# Patient Record
Sex: Female | Born: 1980 | Race: White | Hispanic: No | Marital: Married | Smoking: Never smoker
Health system: Southern US, Community
[De-identification: ages and names within clinical notes are randomized; demographics above are authoritative.]

## PROBLEM LIST (undated history)

## (undated) ENCOUNTER — Inpatient Hospital Stay (HOSPITAL_COMMUNITY): Payer: Self-pay

## (undated) DIAGNOSIS — E039 Hypothyroidism, unspecified: Secondary | ICD-10-CM

## (undated) DIAGNOSIS — Z789 Other specified health status: Secondary | ICD-10-CM

## (undated) DIAGNOSIS — N979 Female infertility, unspecified: Secondary | ICD-10-CM

## (undated) HISTORY — PX: WISDOM TOOTH EXTRACTION: SHX21

---

## 2012-10-31 ENCOUNTER — Ambulatory Visit (HOSPITAL_COMMUNITY)
Admission: AD | Admit: 2012-10-31 | Discharge: 2012-10-31 | Disposition: A | Discharge status: Home / Self Care | Payer: BC Managed Care – PPO | Source: Ambulatory Visit | Attending: Obstetrics & Gynecology | Admitting: Obstetrics & Gynecology

## 2012-10-31 ENCOUNTER — Encounter (HOSPITAL_COMMUNITY)
Admission: AD | Disposition: A | Discharge status: Home / Self Care | Payer: Self-pay | Source: Ambulatory Visit | Attending: Obstetrics & Gynecology

## 2012-10-31 ENCOUNTER — Ambulatory Visit (HOSPITAL_COMMUNITY): Payer: BC Managed Care – PPO | Admitting: Anesthesiology

## 2012-10-31 ENCOUNTER — Encounter (HOSPITAL_COMMUNITY): Payer: Self-pay | Admitting: Pharmacy Technician

## 2012-10-31 ENCOUNTER — Encounter (HOSPITAL_COMMUNITY): Payer: Self-pay | Admitting: Anesthesiology

## 2012-10-31 ENCOUNTER — Encounter (HOSPITAL_COMMUNITY): Payer: Self-pay | Admitting: *Deleted

## 2012-10-31 DIAGNOSIS — N803 Endometriosis of pelvic peritoneum, unspecified: Secondary | ICD-10-CM | POA: Insufficient documentation

## 2012-10-31 DIAGNOSIS — K661 Hemoperitoneum: Secondary | ICD-10-CM | POA: Insufficient documentation

## 2012-10-31 DIAGNOSIS — O00109 Unspecified tubal pregnancy without intrauterine pregnancy: Secondary | ICD-10-CM | POA: Insufficient documentation

## 2012-10-31 HISTORY — PX: LAPAROSCOPY: SHX197

## 2012-10-31 HISTORY — PX: CHROMOPERTUBATION: SHX6288

## 2012-10-31 HISTORY — PX: ABLATION ON ENDOMETRIOSIS: SHX5787

## 2012-10-31 HISTORY — PX: SALPINGOOPHORECTOMY: SHX82

## 2012-10-31 LAB — CBC
HCT: 37.8 % (ref 36.0–46.0)
Hemoglobin: 13.5 g/dL (ref 12.0–15.0)
MCH: 30.5 pg (ref 26.0–34.0)
MCHC: 35.7 g/dL (ref 30.0–36.0)
MCV: 85.5 fL (ref 78.0–100.0)
Platelets: 240 10*3/uL (ref 150–400)
RBC: 4.42 MIL/uL (ref 3.87–5.11)
RDW: 11.7 % (ref 11.5–15.5)
WBC: 7.5 10*3/uL (ref 4.0–10.5)

## 2012-10-31 SURGERY — LAPAROSCOPY OPERATIVE
Anesthesia: General | Site: Abdomen | Laterality: Right | Wound class: Clean Contaminated

## 2012-10-31 MED ORDER — NEOSTIGMINE METHYLSULFATE 1 MG/ML IJ SOLN
INTRAMUSCULAR | Status: AC
  Filled 2012-10-31: qty 1

## 2012-10-31 MED ORDER — MIDAZOLAM HCL 2 MG/2ML IJ SOLN
INTRAMUSCULAR | Status: AC
  Filled 2012-10-31: qty 2

## 2012-10-31 MED ORDER — LACTATED RINGERS IR SOLN
Status: DC | PRN
  Administered 2012-10-31: 1000 mL

## 2012-10-31 MED ORDER — MEPERIDINE HCL 25 MG/ML IJ SOLN: 6.2500 mg | INTRAMUSCULAR | Status: DC | PRN

## 2012-10-31 MED ORDER — PROPOFOL 10 MG/ML IV BOLUS
INTRAVENOUS | Status: DC | PRN
  Administered 2012-10-31: 150 mg via INTRAVENOUS

## 2012-10-31 MED ORDER — DEXAMETHASONE SODIUM PHOSPHATE 10 MG/ML IJ SOLN
INTRAMUSCULAR | Status: DC | PRN
  Administered 2012-10-31: 10 mg via INTRAVENOUS

## 2012-10-31 MED ORDER — PROPOFOL 10 MG/ML IV EMUL
INTRAVENOUS | Status: AC
  Filled 2012-10-31: qty 20

## 2012-10-31 MED ORDER — CEFAZOLIN SODIUM-DEXTROSE 2-3 GM-% IV SOLR
INTRAVENOUS | Status: AC
  Filled 2012-10-31: qty 50

## 2012-10-31 MED ORDER — LACTATED RINGERS IR SOLN
Status: DC | PRN
  Administered 2012-10-31: 3000 mL

## 2012-10-31 MED ORDER — LACTATED RINGERS IV SOLN: INTRAVENOUS | Status: DC

## 2012-10-31 MED ORDER — ROCURONIUM BROMIDE 100 MG/10ML IV SOLN
INTRAVENOUS | Status: DC | PRN
  Administered 2012-10-31: 30 mg via INTRAVENOUS

## 2012-10-31 MED ORDER — ONDANSETRON HCL 4 MG/2ML IJ SOLN
INTRAMUSCULAR | Status: DC | PRN
  Administered 2012-10-31: 4 mg via INTRAVENOUS

## 2012-10-31 MED ORDER — FENTANYL CITRATE 0.05 MG/ML IJ SOLN
INTRAMUSCULAR | Status: DC | PRN
  Administered 2012-10-31: 150 ug via INTRAVENOUS
  Administered 2012-10-31: 100 ug via INTRAVENOUS

## 2012-10-31 MED ORDER — KETOROLAC TROMETHAMINE 30 MG/ML IJ SOLN: 15.0000 mg | Freq: Once | INTRAMUSCULAR | Status: DC | PRN

## 2012-10-31 MED ORDER — NEOSTIGMINE METHYLSULFATE 1 MG/ML IJ SOLN
INTRAMUSCULAR | Status: DC | PRN
  Administered 2012-10-31: 2 mg via INTRAVENOUS

## 2012-10-31 MED ORDER — ROCURONIUM BROMIDE 50 MG/5ML IV SOLN
INTRAVENOUS | Status: AC
  Filled 2012-10-31: qty 1

## 2012-10-31 MED ORDER — OXYCODONE-ACETAMINOPHEN 5-325 MG PO TABS
ORAL_TABLET | ORAL | Status: AC
  Filled 2012-10-31: qty 1

## 2012-10-31 MED ORDER — LACTATED RINGERS IV SOLN
INTRAVENOUS | Status: DC
  Administered 2012-10-31 (×3): via INTRAVENOUS

## 2012-10-31 MED ORDER — BUPIVACAINE HCL (PF) 0.25 % IJ SOLN
INTRAMUSCULAR | Status: DC | PRN
  Administered 2012-10-31: 15 mL

## 2012-10-31 MED ORDER — FENTANYL CITRATE 0.05 MG/ML IJ SOLN
INTRAMUSCULAR | Status: AC
  Administered 2012-10-31: 50 ug via INTRAVENOUS
  Filled 2012-10-31: qty 2

## 2012-10-31 MED ORDER — GLYCOPYRROLATE 0.2 MG/ML IJ SOLN
INTRAMUSCULAR | Status: DC | PRN
  Administered 2012-10-31: 0.4 mg via INTRAVENOUS

## 2012-10-31 MED ORDER — KETOROLAC TROMETHAMINE 30 MG/ML IJ SOLN
INTRAMUSCULAR | Status: AC
  Filled 2012-10-31: qty 1

## 2012-10-31 MED ORDER — CEFAZOLIN SODIUM-DEXTROSE 2-3 GM-% IV SOLR
2.0000 g | Freq: Once | INTRAVENOUS | Status: AC
  Administered 2012-10-31: 2 g via INTRAVENOUS

## 2012-10-31 MED ORDER — METHYLENE BLUE 1 % INJ SOLN
INTRAMUSCULAR | Status: DC | PRN
  Administered 2012-10-31: 1 mL via SUBMUCOSAL

## 2012-10-31 MED ORDER — MIDAZOLAM HCL 5 MG/5ML IJ SOLN
INTRAMUSCULAR | Status: DC | PRN
  Administered 2012-10-31: 2 mg via INTRAVENOUS

## 2012-10-31 MED ORDER — FENTANYL CITRATE 0.05 MG/ML IJ SOLN
INTRAMUSCULAR | Status: AC
  Filled 2012-10-31: qty 5

## 2012-10-31 MED ORDER — PROMETHAZINE HCL 25 MG/ML IJ SOLN: 6.2500 mg | INTRAMUSCULAR | Status: DC | PRN

## 2012-10-31 MED ORDER — LIDOCAINE HCL (CARDIAC) 20 MG/ML IV SOLN
INTRAVENOUS | Status: AC
  Filled 2012-10-31: qty 5

## 2012-10-31 MED ORDER — OXYCODONE-ACETAMINOPHEN 5-325 MG PO TABS
1.0000 | ORAL_TABLET | ORAL | Status: DC | PRN
  Administered 2012-10-31: 1 via ORAL

## 2012-10-31 MED ORDER — OXYCODONE-ACETAMINOPHEN 7.5-325 MG PO TABS: 1.0000 | ORAL_TABLET | ORAL | Status: DC | PRN

## 2012-10-31 MED ORDER — GLYCOPYRROLATE 0.2 MG/ML IJ SOLN
INTRAMUSCULAR | Status: AC
  Filled 2012-10-31: qty 2

## 2012-10-31 MED ORDER — KETOROLAC TROMETHAMINE 30 MG/ML IJ SOLN
INTRAMUSCULAR | Status: DC | PRN
  Administered 2012-10-31: 30 mg via INTRAVENOUS

## 2012-10-31 MED ORDER — LIDOCAINE HCL (CARDIAC) 20 MG/ML IV SOLN
INTRAVENOUS | Status: DC | PRN
  Administered 2012-10-31: 60 mg via INTRAVENOUS

## 2012-10-31 MED ORDER — ONDANSETRON HCL 4 MG/2ML IJ SOLN
INTRAMUSCULAR | Status: AC
  Filled 2012-10-31: qty 2

## 2012-10-31 MED ORDER — DEXAMETHASONE SODIUM PHOSPHATE 10 MG/ML IJ SOLN
INTRAMUSCULAR | Status: AC
  Filled 2012-10-31: qty 1

## 2012-10-31 MED ORDER — FENTANYL CITRATE 0.05 MG/ML IJ SOLN
25.0000 ug | INTRAMUSCULAR | Status: DC | PRN
  Administered 2012-10-31 (×2): 50 ug via INTRAVENOUS

## 2012-10-31 MED ORDER — MIDAZOLAM HCL 2 MG/2ML IJ SOLN: 0.5000 mg | Freq: Once | INTRAMUSCULAR | Status: DC | PRN

## 2012-10-31 SURGICAL SUPPLY — 29 items
APPLICATOR COTTON TIP 6IN STRL (MISCELLANEOUS) ×5 IMPLANT
CABLE HIGH FREQUENCY MONO STRZ (ELECTRODE) IMPLANT
CATH ROBINSON RED A/P 16FR (CATHETERS) ×5 IMPLANT
CLOTH BEACON ORANGE TIMEOUT ST (SAFETY) ×5 IMPLANT
DERMABOND ADVANCED (GAUZE/BANDAGES/DRESSINGS) ×1
DERMABOND ADVANCED .7 DNX12 (GAUZE/BANDAGES/DRESSINGS) ×4 IMPLANT
GLOVE BIO SURGEON STRL SZ 6.5 (GLOVE) ×5 IMPLANT
GLOVE BIOGEL PI IND STRL 7.0 (GLOVE) ×4 IMPLANT
GLOVE BIOGEL PI INDICATOR 7.0 (GLOVE) ×1
GOWN PREVENTION PLUS LG XLONG (DISPOSABLE) ×15 IMPLANT
IV STOPCOCK 4 WAY 40  W/Y SET (IV SOLUTION) ×1
IV STOPCOCK 4 WAY 40 W/Y SET (IV SOLUTION) ×4 IMPLANT
MANIPULATOR UTERINE 4.5 ZUMI (MISCELLANEOUS) IMPLANT
PACK LAPAROSCOPY BASIN (CUSTOM PROCEDURE TRAY) ×5 IMPLANT
PENCIL BUTTON HOLSTER BLD 10FT (ELECTRODE) ×5 IMPLANT
POUCH SPECIMEN RETRIEVAL 10MM (ENDOMECHANICALS) ×5 IMPLANT
PROTECTOR NERVE ULNAR (MISCELLANEOUS) ×5 IMPLANT
SEALER TISSUE G2 CVD JAW 35 (ENDOMECHANICALS) ×4 IMPLANT
SEALER TISSUE G2 CVD JAW 45CM (ENDOMECHANICALS) ×1 IMPLANT
SET IRRIG TUBING LAPAROSCOPIC (IRRIGATION / IRRIGATOR) ×5 IMPLANT
SUT MNCRL AB 4-0 PS2 18 (SUTURE) ×10 IMPLANT
SUT VICRYL 0 UR6 27IN ABS (SUTURE) ×10 IMPLANT
TOWEL OR 17X24 6PK STRL BLUE (TOWEL DISPOSABLE) ×10 IMPLANT
TRAY FOLEY CATH 14FR (SET/KITS/TRAYS/PACK) ×5 IMPLANT
TROCAR BALLN 12MMX100 BLUNT (TROCAR) ×5 IMPLANT
TROCAR XCEL NON-BLD 11X100MML (ENDOMECHANICALS) IMPLANT
TROCAR XCEL NON-BLD 5MMX100MML (ENDOMECHANICALS) ×5 IMPLANT
TROCAR XCEL OPT SLVE 5M 100M (ENDOMECHANICALS) IMPLANT
WATER STERILE IRR 1000ML POUR (IV SOLUTION) ×10 IMPLANT

## 2012-10-31 NOTE — Op Note (Signed)
10/31/2012  4:01 PM  PATIENT:  Kristen [name]  32 y.o. female  PRE-OPERATIVE DIAGNOSIS:  Left ectopic with cardiac activity  POST-OPERATIVE DIAGNOSIS:  Left ampullary ruptured ectopic.  Hemoperitoneum.  Normal patent right tube. Possible mild endometriosis.   PROCEDURE:  Procedure(s): LAPAROSCOPY OPERATIVE LEFT SALPINGECTOMY. CHROMOPERTUBATION CAUTHERIZATION OF ENDOMETRIOSIS  SURGEON:  Surgeon(s): Genia Del, MD  ASSISTANTS: none   ANESTHESIA:   general  PROCEDURE:  Under general anesthesia with endotracheal intubation the patient is in lithotomy position. She is prepped with ChloraPrep on the abdomen and with Betadine on the suprapubic, vulvar and vaginal areas. She is draped as usual. A Foley is introduced and the bladder. The speculum is inserted in the vagina, the anterior lip of the cervix is grasped with a tenaculum and the acorn is used to cannulate the uterus.  The speculum is removed. The infraumbilical area is infiltrated with Marcaine one quarter plain 10 cc. The aponeurosis is grasped with cokers and opened with Mayo scissors. The peritoneum is opened with Mayo scissors as well under direct vision. A pursestring stitch of Vicryl 0 is done on the aponeurosis. The Roseanne Reno is inserted under direct vision. The pneumoperitoneum is created with CO2. The camera is inserted at that level. 5 mm incisions are done on either aspect of the lower abdomen after infiltration of Marcaine one quarter plain at both levels. The 5 mm trochars were inserted on either side under direct vision.    We note a hemoperitoneum of around 100 cc. The left tube presents an ampullary ectopic pregnancy with a very thin wall and a blood clot and bleeding at the fimbriae.  The left ovary is normal in size and appearance. The right ovary is normal in size and appearance as well. The right tube appears normal with normal fimbriae, except for a tubal length which appears longer than average.  The uterus is normal  in size and appearance. A possible lesion of endometriosis is seen on the right posterior side of the uterus, external to the right uterosacral ligament.  Fenestration is present with a thicker whitish area which was later cauterized with N-seal.  Methylene blue is injected through the acorn and good passage of methylene blue is seen at the distal end of the right tube confirming patency.  We used N-seal to cauterize and section the proximal left tube and then cauterized and section the left mesosalpinx, detaching the left tube completely with the ectopic pregnancy.  We then switched to a 5 mm camera and used the Endobag at the infraumbilical port to remove the left tube with the ectopic. We sent the specimen to pathology.  Hemostasis was adequate at all levels. The Nezhat was used to irrigate and suctioned the abdominopelvic cavities.  We took pictures of the liver which appeared normal and the appendix which also appeared normal. We also took pictures of the pelvic organs after the left salpingectomy.  All instruments were removed. Trochars were removed under direct vision.  The CO2 was evacuated.  The pursestring stitch of Vicryl 0 was closed at the infraumbilical incision to close the aponeurosis. We then used Monocryl 4-0 to do a subcuticular stitch on all incisions. Dermabond was added on all incisions. Hemostasis was adequate.  The acorn and tenaculum were removed from the vagina.  The Foley was removed in the OR. The patient was brought to recovery room in good and stable status.  ESTIMATED BLOOD LOSS:  10 cc and hemoperitoneum of 100 cc.   Intake/Output Summary (Last  24 hours) at 10/31/12 1601 Last data filed at 10/31/12 1558  Gross per 24 hour  Intake   1000 ml  Output    210 ml  Net    790 ml     BLOOD ADMINISTERED:none   LOCAL MEDICATIONS USED:  MARCAINE     SPECIMEN:  Source of Specimen:  Left tube with ectopic  DISPOSITION OF SPECIMEN:  PATHOLOGY  COUNTS:  YES  PLAN OF CARE:  Transfer to PACU    Genia Del MD  10/31/2012 at 4:02 pm

## 2012-10-31 NOTE — Anesthesia Preprocedure Evaluation (Addendum)
Anesthesia Evaluation  Patient identified by MRN, date of birth, ID band Patient awake    Reviewed: Allergy & Precautions, H&P , Patient's Chart, lab work & pertinent test results, reviewed documented beta blocker date and time   History of Anesthesia Complications Negative for: history of anesthetic complications  Airway Mallampati: II TM Distance: >3 FB Neck ROM: full    Dental no notable dental hx.    Pulmonary neg pulmonary ROS,  breath sounds clear to auscultation  Pulmonary exam normal       Cardiovascular Exercise Tolerance: Good negative cardio ROS  Rhythm:regular Rate:Normal     Neuro/Psych negative neurological ROS  negative psych ROS   GI/Hepatic negative GI ROS, Neg liver ROS,   Endo/Other  negative endocrine ROS  Renal/GU negative Renal ROS     Musculoskeletal   Abdominal   Peds  Hematology negative hematology ROS (+)   Anesthesia Other Findings   Reproductive/Obstetrics negative OB ROS                           Anesthesia Physical Anesthesia Plan  ASA: I and emergent  Anesthesia Plan: General ETT   Post-op Pain Management:    Induction:   Airway Management Planned:   Additional Equipment:   Intra-op Plan:   Post-operative Plan:   Informed Consent: I have reviewed the patients History and Physical, chart, labs and discussed the procedure including the risks, benefits and alternatives for the proposed anesthesia with the patient or authorized representative who has indicated his/her understanding and acceptance.   Dental Advisory Given  Plan Discussed with: CRNA and Surgeon  Anesthesia Plan Comments:         Anesthesia Quick Evaluation

## 2012-10-31 NOTE — Transfer of Care (Signed)
Immediate Anesthesia Transfer of Care Note  Patient: Kristen [name]  Procedure(s) Performed: Procedure(s): LAPAROSCOPY OPERATIVE  SALPINGECTOMY (Right) CHROMOPERTUBATION SALPINGO OOPHORECTOMY ABLATION ON ENDOMETRIOSIS (N/A)  Patient Location: PACU  Anesthesia Type:General  Level of Consciousness: sedated  Airway & Oxygen Therapy: Patient Spontanous Breathing and Patient connected to nasal cannula oxygen  Post-op Assessment: Report given to PACU RN and Post -op Vital signs reviewed and stable  Post vital signs: stable  Complications: No apparent anesthesia complications

## 2012-10-31 NOTE — H&P (Signed)
Tejasvi GOLDA ZAVALZA is an 32 y.o. female G1 6 wks per LMP  RP:  Left ectopic pregnancy with cardiac activity 2.2 cm, no rupture  Pertinent Gynecological History: Menses: flow is moderate Bleeding: None currently Contraception: none Blood transfusions: none Sexually transmitted diseases: no past history Previous GYN Procedures: None  Last mammogram: None Last pap: Normal OB History: Current  Menstrual History:  No LMP recorded.    History reviewed. No pertinent past medical history.  History reviewed. No pertinent past surgical history.  History reviewed. No pertinent family history.  Social History:  reports that she has never smoked. She has never used smokeless tobacco. She reports that she does not drink alcohol or use illicit drugs.  Allergies: No Known Allergies  Prescriptions prior to admission  Medication Sig Dispense Refill  . Prenatal Vit-Fe Fumarate-FA (PRENATAL MULTIVITAMIN) TABS tablet Take 1 tablet by mouth daily at 12 noon.        @ROS @  Blood pressure 96/63, temperature 99.7 F (37.6 C), temperature source Oral, resp. rate 20, height 5\' 7"  (1.702 m), weight 62.143 kg (137 lb), SpO2 100.00%.  Pelvic US 10/31/2012  Left ectopic pregnancy with cardiac activity 2.2 cm.  No FF.  Otherwise normal pelvic US.  Results for orders placed during the hospital encounter of 10/31/12 (from the past 24 hour(s))  CBC     Status: None   Collection Time    10/31/12 10:30 AM      Result Value Range   WBC 7.5  4.0 - 10.5 K/uL   RBC 4.42  3.87 - 5.11 MIL/uL   Hemoglobin 13.5  12.0 - 15.0 g/dL   HCT 16.1  09.6 - 04.5 %   MCV 85.5  78.0 - 100.0 fL   MCH 30.5  26.0 - 34.0 pg   MCHC 35.7  30.0 - 36.0 g/dL   RDW 40.9  81.1 - 91.4 %   Platelets 240  150 - 400 K/uL  TYPE AND SCREEN     Status: None   Collection Time    10/31/12 10:30 AM      Result Value Range   ABO/RH(D) B POS     Antibody Screen POS     Sample Expiration 11/03/2012     Antibody Identification NO CLINICALLY  SIGNIFICANT ANTIBODY IDENTIFIED.     DAT, IgG NEG      No results found.  Assessment/Plan: Left ectopic pregnancy with cardiac activity present 2.2 cm, not ruptured.  Patient had infertility on Clomid/IUI.  Will proceed with LPS probable left Salpingectomy, possible left Salpingostomy, possible laparotomy.  Informed consent signed.  Risks of surgery, post op and best approach for future fertility reviewed.  Patient agrees and understands.  Sampson Self,MARIE-LYNE 10/31/2012, 2:09 PM

## 2012-10-31 NOTE — Anesthesia Postprocedure Evaluation (Signed)
  Anesthesia Post-op Note  Anesthesia Post Note  Patient: Kristen [name]  Procedure(s) Performed: Procedure(s) (LRB): LAPAROSCOPY OPERATIVE  SALPINGECTOMY (Right) CHROMOPERTUBATION SALPINGO OOPHORECTOMY ABLATION ON ENDOMETRIOSIS (N/A)  Anesthesia type: General  Patient location: PACU  Post pain: Pain level controlled  Post assessment: Post-op Vital signs reviewed  Last Vitals:  Filed Vitals:   10/31/12 1715  BP: 101/56  Pulse: 82  Temp: 37.1 C  Resp: 20    Post vital signs: Reviewed  Level of consciousness: sedated  Complications: No apparent anesthesia complications

## 2012-10-31 NOTE — Discharge Summary (Signed)
  Physician Discharge Summary  Patient ID: Kristen [name] MRN: [id number] DOB/AGE: August 31, 1980 32 y.o.  Admit date: 10/31/2012 Discharge date: 10/31/2012  Admission Diagnoses: ECTOPIC PREG ectopic with heart beat  Discharge Diagnoses: Left ampullary ruptured ectopic pregnancy, hemoperitoneum, possible mild endometriosis        Active Problems:   * No active hospital problems. *   Discharged Condition: good  Hospital Course:  Outpatient  Consults: None  Treatments: surgery:  Laparoscopic left Salpingectomy, evacuation of hemoperitoneum, cautherization of endometriosis.  Disposition: Final discharge disposition not confirmed     Medication List    STOP taking these medications       prenatal multivitamin Tabs tablet      TAKE these medications       oxyCODONE-acetaminophen 7.5-325 MG per tablet  Commonly known as:  PERCOCET  Take 1 tablet by mouth every 4 (four) hours as needed for pain.           Follow-up Information   Follow up with Latanya Hemmer,MARIE-LYNE, MD In 3 weeks. (Post op with Dr Juliene Pina at patient's discretion)    Specialty:  Obstetrics and Gynecology   Contact information:   [address] [place] [address] [phone]       Signed: Genia Del, MD 10/31/2012, 4:24 PM

## 2012-11-01 ENCOUNTER — Encounter (HOSPITAL_COMMUNITY): Payer: Self-pay | Admitting: Obstetrics & Gynecology

## 2012-11-03 LAB — TYPE AND SCREEN
ABO/RH(D): B POS
Antibody Screen: POSITIVE
DAT, IgG: NEGATIVE
Unit division: 0
Unit division: 0

## 2013-08-21 ENCOUNTER — Other Ambulatory Visit (HOSPITAL_COMMUNITY): Payer: Self-pay | Admitting: Obstetrics & Gynecology

## 2013-08-21 DIAGNOSIS — Z8742 Personal history of other diseases of the female genital tract: Secondary | ICD-10-CM

## 2013-08-25 ENCOUNTER — Ambulatory Visit (HOSPITAL_COMMUNITY)
Admission: RE | Admit: 2013-08-25 | Discharge: 2013-08-25 | Disposition: A | Discharge status: Home / Self Care | Payer: Commercial Indemnity | Source: Ambulatory Visit | Attending: Obstetrics & Gynecology | Admitting: Obstetrics & Gynecology

## 2013-08-25 DIAGNOSIS — N979 Female infertility, unspecified: Secondary | ICD-10-CM | POA: Insufficient documentation

## 2013-08-25 DIAGNOSIS — Z8742 Personal history of other diseases of the female genital tract: Secondary | ICD-10-CM

## 2013-08-25 MED ORDER — IOHEXOL 300 MG/ML  SOLN
20.0000 mL | Freq: Once | INTRAMUSCULAR | Status: AC | PRN
  Administered 2013-08-25: 20 mL

## 2014-02-08 ENCOUNTER — Encounter (HOSPITAL_COMMUNITY): Payer: Self-pay | Admitting: Emergency Medicine

## 2014-02-08 ENCOUNTER — Emergency Department (HOSPITAL_COMMUNITY)
Admission: EM | Admit: 2014-02-08 | Discharge: 2014-02-08 | Disposition: A | Discharge status: Home / Self Care | Payer: Commercial Indemnity | Attending: Emergency Medicine | Admitting: Emergency Medicine

## 2014-02-08 DIAGNOSIS — R109 Unspecified abdominal pain: Secondary | ICD-10-CM | POA: Diagnosis present

## 2014-02-08 DIAGNOSIS — N390 Urinary tract infection, site not specified: Secondary | ICD-10-CM

## 2014-02-08 DIAGNOSIS — Z87442 Personal history of urinary calculi: Secondary | ICD-10-CM | POA: Diagnosis not present

## 2014-02-08 DIAGNOSIS — Z79891 Long term (current) use of opiate analgesic: Secondary | ICD-10-CM | POA: Diagnosis not present

## 2014-02-08 DIAGNOSIS — Z3202 Encounter for pregnancy test, result negative: Secondary | ICD-10-CM | POA: Insufficient documentation

## 2014-02-08 LAB — WET PREP, GENITAL
TRICH WET PREP: NONE SEEN
Yeast Wet Prep HPF POC: NONE SEEN

## 2014-02-08 LAB — PREGNANCY, URINE: Preg Test, Ur: NEGATIVE

## 2014-02-08 LAB — URINALYSIS, ROUTINE W REFLEX MICROSCOPIC
Bilirubin Urine: NEGATIVE
Glucose, UA: NEGATIVE mg/dL
KETONES UR: NEGATIVE mg/dL
LEUKOCYTES UA: NEGATIVE
NITRITE: NEGATIVE
PH: 6 (ref 5.0–8.0)
Protein, ur: NEGATIVE mg/dL
SPECIFIC GRAVITY, URINE: 1.006 (ref 1.005–1.030)
Urobilinogen, UA: 0.2 mg/dL (ref 0.0–1.0)

## 2014-02-08 LAB — URINE MICROSCOPIC-ADD ON

## 2014-02-08 MED ORDER — CEPHALEXIN 500 MG PO CAPS: 500.0000 mg | ORAL_CAPSULE | Freq: Two times a day (BID) | ORAL | Status: DC

## 2014-02-08 MED ORDER — IBUPROFEN 800 MG PO TABS
800.0000 mg | ORAL_TABLET | Freq: Once | ORAL | Status: AC
  Administered 2014-02-08: 800 mg via ORAL
  Filled 2014-02-08: qty 1

## 2014-02-08 MED ORDER — CEPHALEXIN 250 MG PO CAPS
500.0000 mg | ORAL_CAPSULE | Freq: Once | ORAL | Status: AC
  Administered 2014-02-08: 500 mg via ORAL
  Filled 2014-02-08: qty 2

## 2014-02-08 NOTE — ED Provider Notes (Signed)
CSN: [id number]     Arrival date & time 02/08/14  0440 History   First MD Initiated Contact with Patient 02/08/14 0444     Chief Complaint  Patient presents with  . Abdominal Pain     (Consider location/radiation/quality/duration/timing/severity/associated sxs/prior Treatment) HPI Ms. Katrinka BlazingSmith is a 33 year old female with no significant past medical history coming in with abdominal pain. Patient states this began earlier this evening. It is suprapubic pain that is cramping in sensation. It radiates out to her left lower quadrant as well. She has a history of nephrolithiasis and states this is consistent with that. She has had slight nausea but no vomiting. She's had increased urinary frequency. She denies any abnormal vaginal bleeding or abnormal vaginal discharge. She's had no fevers or recent infections. Patient has no further complaints.  10 Systems reviewed and are negative for acute change except as noted in the HPI.    History reviewed. No pertinent past medical history. Past Surgical History  Procedure Laterality Date  . Laparoscopy Right 10/31/2012    Procedure: LAPAROSCOPY OPERATIVE  SALPINGECTOMY;  Surgeon: Genia DelMarie-Lyne Lavoie, MD;  Location: [facility];  Service: Gynecology;  Laterality: Right;  . Chromopertubation  10/31/2012    Procedure: CHROMOPERTUBATION;  Surgeon: Genia DelMarie-Lyne Lavoie, MD;  Location: [facility];  Service: Gynecology;;  . Salpingoophorectomy  10/31/2012    Procedure: SALPINGO OOPHORECTOMY;  Surgeon: Genia DelMarie-Lyne Lavoie, MD;  Location: [facility];  Service: Gynecology;;  . Ablation on endometriosis N/A 10/31/2012    Procedure: ABLATION ON ENDOMETRIOSIS;  Surgeon: Genia DelMarie-Lyne Lavoie, MD;  Location: [facility];  Service: Gynecology;  Laterality: N/A;   History reviewed. No pertinent family history. History  Substance Use Topics  . Smoking status: Never Smoker   . Smokeless tobacco: Never Used  . Alcohol Use: No   OB History    No data available     Review of  Systems    Allergies  Review of patient's allergies indicates no known allergies.  Home Medications   Prior to Admission medications   Medication Sig Start Date End Date Taking? Authorizing Provider  cephALEXin (KEFLEX) 500 MG capsule Take 1 capsule (500 mg total) by mouth 2 (two) times daily. 02/08/14   Tomasita CrumbleAdeleke Adeliz Tonkinson, MD  oxyCODONE-acetaminophen (PERCOCET) 7.5-325 MG per tablet Take 1 tablet by mouth every 4 (four) hours as needed for pain. 10/31/12   Genia DelMarie-Lyne Lavoie, MD   BP 121/81 mmHg  Pulse 89  Temp(Src) 98.4 F (36.9 C) (Oral)  Resp 18  Ht 5\' 7"  (1.702 m)  Wt 135 lb (61.236 kg)  BMI 21.14 kg/m2  SpO2 100%  LMP 01/20/2014 Physical Exam  Constitutional: She is oriented to person, place, and time. She appears well-developed and well-nourished. No distress.  HENT:  Head: Normocephalic and atraumatic.  Eyes: Conjunctivae and EOM are normal. Pupils are equal, round, and reactive to light. No scleral icterus.  Neck: Normal range of motion. Neck supple. No JVD present. No tracheal deviation present. No thyromegaly present.  Cardiovascular: Normal rate, regular rhythm and normal heart sounds.  Exam reveals no gallop and no friction rub.   No murmur heard. Pulmonary/Chest: Effort normal and breath sounds normal. No respiratory distress. She has no wheezes. She exhibits no tenderness.  Abdominal: Soft. Bowel sounds are normal. She exhibits no distension and no mass. There is tenderness. There is no rebound and no guarding.  Suprapubic tenderness to palpation.  Genitourinary: No vaginal discharge found.  No CMT or adnexal tenderness.  Musculoskeletal: Normal range of motion. She exhibits no edema  or tenderness.  Lymphadenopathy:    She has no cervical adenopathy.  Neurological: She is alert and oriented to person, place, and time. No cranial nerve deficit. She exhibits normal muscle tone.  Skin: Skin is warm and dry. No rash noted. No erythema. No pallor.  Nursing note and vitals  reviewed.   ED Course  Procedures (including critical care time) Labs Review Labs Reviewed  WET PREP, GENITAL - Abnormal; Notable for the following:    Clue Cells Wet Prep HPF POC MODERATE (*)    WBC, Wet Prep HPF POC MANY (*)    All other components within normal limits  URINALYSIS, ROUTINE W REFLEX MICROSCOPIC - Abnormal; Notable for the following:    Hgb urine dipstick MODERATE (*)    All other components within normal limits  URINE MICROSCOPIC-ADD ON - Abnormal; Notable for the following:    Squamous Epithelial / LPF FEW (*)    Bacteria, UA MANY (*)    All other components within normal limits  URINE CULTURE  GC/CHLAMYDIA PROBE AMP  PREGNANCY, URINE    Imaging Review No results found.   EKG Interpretation None      MDM   Final diagnoses:  UTI (lower urinary tract infection)    Patient since emergency department a concern for abdominal pain. Pelvic exam is unremarkable. Urinalysis reveals many bacteria. There are many white blood cells on the wet prep as well. She was given Keflex and Motrin emergency department. She will be discharged home with Keflex prescription. She is advised to follow-up with primary care physician for continued management. Her vital signs remain within her normal limits and she is safe for discharge.    Tomasita CrumbleAdeleke Gethsemane Fischler, MD 02/08/14 [phone]

## 2014-02-08 NOTE — Discharge Instructions (Signed)
Urinary Tract Infection Kristen [name], You were seen today for abdominal pain and frequent urination.  Your urine shows an infection, take antibiotics as prescribed. Follow-up with a primary care physician within one week for continued management. If symptoms worsen come back to the emergency department immediately for repeat evaluation. Thank you. A urinary tract infection (UTI) can occur any place along the urinary tract. The tract includes the kidneys, ureters, bladder, and urethra. A type of germ called bacteria often causes a UTI. UTIs are often helped with antibiotic medicine.  HOME CARE   If given, take antibiotics as told by your doctor. Finish them even if you start to feel better.  Drink enough fluids to keep your pee (urine) clear or pale yellow.  Avoid tea, drinks with caffeine, and bubbly (carbonated) drinks.  Pee often. Avoid holding your pee in for a long time.  Pee before and after having sex (intercourse).  Wipe from front to back after you poop (bowel movement) if you are a woman. Use each tissue only once. GET HELP RIGHT AWAY IF:   You have back pain.  You have lower belly (abdominal) pain.  You have chills.  You feel sick to your stomach (nauseous).  You throw up (vomit).  Your burning or discomfort with peeing does not go away.  You have a fever.  Your symptoms are not better in 3 days. MAKE SURE YOU:   Understand these instructions.  Will watch your condition.  Will get help right away if you are not doing well or get worse. Document Released: 07/22/2007 Document Revised: 10/28/2011 Document Reviewed: 09/03/2011 [facility] Patient Information 2015 [employer], [place]. This information is not intended to replace advice given to you by your health care provider. Make sure you discuss any questions you have with your health care provider.

## 2014-02-08 NOTE — ED Notes (Signed)
Family at bedside. 

## 2014-02-08 NOTE — ED Notes (Signed)
Pt here with c/o lower abd pain , pain started yesterday at 6pm , no n/v/d , pt does have to urinate a lot , no burning, bleeding or discharge

## 2014-02-09 LAB — URINE CULTURE
Colony Count: NO GROWTH
Culture: NO GROWTH

## 2014-02-09 LAB — GC/CHLAMYDIA PROBE AMP
CT Probe RNA: NEGATIVE
GC PROBE AMP APTIMA: NEGATIVE

## 2014-08-01 ENCOUNTER — Other Ambulatory Visit: Payer: Self-pay

## 2014-08-01 LAB — OB RESULTS CONSOLE GC/CHLAMYDIA
Chlamydia: NEGATIVE
GC PROBE AMP, GENITAL: NEGATIVE

## 2014-08-03 LAB — CYTOLOGY - PAP

## 2014-08-08 LAB — OB RESULTS CONSOLE HIV ANTIBODY (ROUTINE TESTING): HIV: NONREACTIVE

## 2014-08-08 LAB — OB RESULTS CONSOLE RPR: RPR: NONREACTIVE

## 2014-08-08 LAB — OB RESULTS CONSOLE HEPATITIS B SURFACE ANTIGEN: HEP B S AG: NEGATIVE

## 2014-08-08 LAB — OB RESULTS CONSOLE RUBELLA ANTIBODY, IGM: RUBELLA: IMMUNE

## 2014-11-02 ENCOUNTER — Inpatient Hospital Stay (HOSPITAL_COMMUNITY)
Admission: AD | Admit: 2014-11-02 | Discharge: 2014-11-02 | Disposition: A | Discharge status: Home / Self Care | Payer: Commercial Indemnity | Source: Ambulatory Visit | Attending: Obstetrics and Gynecology | Admitting: Obstetrics and Gynecology

## 2014-11-02 ENCOUNTER — Encounter (HOSPITAL_COMMUNITY): Payer: Self-pay | Admitting: *Deleted

## 2014-11-02 DIAGNOSIS — O26892 Other specified pregnancy related conditions, second trimester: Secondary | ICD-10-CM | POA: Diagnosis not present

## 2014-11-02 DIAGNOSIS — K2100 Gastro-esophageal reflux disease with esophagitis, without bleeding: Secondary | ICD-10-CM

## 2014-11-02 DIAGNOSIS — K219 Gastro-esophageal reflux disease without esophagitis: Secondary | ICD-10-CM | POA: Insufficient documentation

## 2014-11-02 DIAGNOSIS — R0789 Other chest pain: Secondary | ICD-10-CM | POA: Insufficient documentation

## 2014-11-02 DIAGNOSIS — O99612 Diseases of the digestive system complicating pregnancy, second trimester: Secondary | ICD-10-CM | POA: Diagnosis not present

## 2014-11-02 DIAGNOSIS — Z3A24 24 weeks gestation of pregnancy: Secondary | ICD-10-CM | POA: Insufficient documentation

## 2014-11-02 DIAGNOSIS — R079 Chest pain, unspecified: Secondary | ICD-10-CM | POA: Diagnosis present

## 2014-11-02 DIAGNOSIS — O9989 Other specified diseases and conditions complicating pregnancy, childbirth and the puerperium: Secondary | ICD-10-CM | POA: Diagnosis not present

## 2014-11-02 HISTORY — DX: Other specified health status: Z78.9

## 2014-11-02 MED ORDER — OMEPRAZOLE 20 MG PO CPDR: 20.0000 mg | DELAYED_RELEASE_CAPSULE | Freq: Every day | ORAL | Status: DC

## 2014-11-02 MED ORDER — SUCRALFATE 1 G PO TABS: 1.0000 g | ORAL_TABLET | Freq: Four times a day (QID) | ORAL | Status: DC | PRN

## 2014-11-02 NOTE — MAU Provider Note (Signed)
MAU HISTORY AND PHYSICAL  Chief Complaint:  Chest Pain   Kristen [name] is a 34 y.o.  G2P0010 with IUP at [redacted]w[redacted]d presenting for Chest Pain  Patient has had gerd symptoms for 2 months: substernal burning that rises up her chest, worse after heavy meals and with lying down. Taking zantac for that prn. Four times in last 2 weeks has had episode of multi-hour sharp pain substernally that won't go away, last 3 days ago. Saw her ob for this, recommended presenting here for ecg. Pain starts at rest, isn't worsened with activity. No previous history of such pain. No history CAD in first-degree family members.  When saw ob on Wednesday told to start taking h2 blocker bid instead of prn.  No stomach pain, no blood in stool or melena. No vaginal bleeding or leakage of fluid.  Past Medical History  Diagnosis Date  . Medical history non-contributory     Past Surgical History  Procedure Laterality Date  . Laparoscopy Right 10/31/2012    Procedure: LAPAROSCOPY OPERATIVE  SALPINGECTOMY;  Surgeon: Genia Del, MD;  Location: [facility];  Service: Gynecology;  Laterality: Right;  . Chromopertubation  10/31/2012    Procedure: CHROMOPERTUBATION;  Surgeon: Genia Del, MD;  Location: [facility];  Service: Gynecology;;  . Salpingoophorectomy  10/31/2012    Procedure: SALPINGO OOPHORECTOMY;  Surgeon: Genia Del, MD;  Location: [facility];  Service: Gynecology;;  . Ablation on endometriosis N/A 10/31/2012    Procedure: ABLATION ON ENDOMETRIOSIS;  Surgeon: Genia Del, MD;  Location: [facility];  Service: Gynecology;  Laterality: N/A;    History reviewed. No pertinent family history.  Social History  Substance Use Topics  . Smoking status: Never Smoker   . Smokeless tobacco: Never Used  . Alcohol Use: No    No Known Allergies  Prescriptions prior to admission  Medication Sig Dispense Refill Last Dose  . cephALEXin (KEFLEX) 500 MG capsule Take 1 capsule (500 mg total) by mouth 2 (two) times daily.  6 capsule 0   . oxyCODONE-acetaminophen (PERCOCET) 7.5-325 MG per tablet Take 1 tablet by mouth every 4 (four) hours as needed for pain. 30 tablet 0     Review of Systems - Negative except for what is mentioned in HPI.  Physical Exam  Blood pressure 112/75, pulse 79, temperature 98.9 F (37.2 C), temperature source Oral, resp. rate 18, height  (1.702 m), weight 149 lb (67.586 kg), last menstrual period 05/17/2014. GENERAL: Well-developed, well-nourished female in no acute distress.  LUNGS: Clear to auscultation bilaterally.  HEART: Regular rate and rhythm. No rubs/murmurs/gallops, no tendernes ABDOMEN: Soft, nontender, nondistended, gravid.  EXTREMITIES: Nontender, no edema, 2+ distal pulses.    Labs: No results found for this or any previous visit (from the past 24 hour(s)).  Imaging Studies:  No results found.   ECG: 77 bpm, normal intervals, no t-wave inversions, no st elevations, no pathologic q waves. No prior available for comparison.  Assessment: Kristen [name] is  34 y.o. G2P0010 at [redacted]w[redacted]d presents with non-cardiac chest pain, likely 2/2 gerd. No actual pain today, normal exam, normal ecg. Low risk for CAD. No epigastric pain or tenderness or hematemesis or melena to suggest gastric ulcer disease.  Plan: - continue ranitidine bid, will add sucralfate to take prn - if this combo does not work, stop ranitidine and start omeprazole, continuing sucralfate prn - ob f/u next week - chest pain return precautions  Kristen [name] 9/16/20166:32 PM

## 2014-11-02 NOTE — MAU Note (Signed)
Patient presents at [redacted] weeks gestation with c/o 4 episodes of chest pain starting 3 weeks ago that occurs after eating lunch and lasts until bedtime. Fetus active. Denies bleeding, discharge or pain at this time.

## 2015-01-23 ENCOUNTER — Other Ambulatory Visit: Payer: Self-pay | Admitting: Obstetrics

## 2015-01-23 LAB — OB RESULTS CONSOLE GBS: GBS: NEGATIVE

## 2015-02-22 ENCOUNTER — Other Ambulatory Visit: Payer: Self-pay | Admitting: Obstetrics

## 2015-02-23 ENCOUNTER — Inpatient Hospital Stay (HOSPITAL_COMMUNITY): Payer: Managed Care, Other (non HMO) | Admitting: Anesthesiology

## 2015-02-23 ENCOUNTER — Encounter (HOSPITAL_COMMUNITY): Payer: Self-pay | Admitting: *Deleted

## 2015-02-23 ENCOUNTER — Inpatient Hospital Stay (HOSPITAL_COMMUNITY)
Admission: AD | Admit: 2015-02-23 | Discharge: 2015-02-27 | DRG: 766 | Disposition: A | Discharge status: Home / Self Care | Payer: Managed Care, Other (non HMO) | Source: Ambulatory Visit | Attending: Obstetrics and Gynecology | Admitting: Obstetrics and Gynecology

## 2015-02-23 DIAGNOSIS — Z3A40 40 weeks gestation of pregnancy: Secondary | ICD-10-CM

## 2015-02-23 DIAGNOSIS — O9902 Anemia complicating childbirth: Secondary | ICD-10-CM | POA: Diagnosis present

## 2015-02-23 DIAGNOSIS — D649 Anemia, unspecified: Secondary | ICD-10-CM | POA: Diagnosis present

## 2015-02-23 DIAGNOSIS — O42119 Preterm premature rupture of membranes, onset of labor more than 24 hours following rupture, unspecified trimester: Secondary | ICD-10-CM

## 2015-02-23 DIAGNOSIS — Z3483 Encounter for supervision of other normal pregnancy, third trimester: Secondary | ICD-10-CM | POA: Diagnosis present

## 2015-02-23 DIAGNOSIS — O4212 Full-term premature rupture of membranes, onset of labor more than 24 hours following rupture: Secondary | ICD-10-CM | POA: Diagnosis present

## 2015-02-23 LAB — PREPARE RBC (CROSSMATCH)

## 2015-02-23 LAB — CBC
HEMATOCRIT: 33.4 % — AB (ref 36.0–46.0)
HEMOGLOBIN: 11 g/dL — AB (ref 12.0–15.0)
MCH: 28.8 pg (ref 26.0–34.0)
MCHC: 32.9 g/dL (ref 30.0–36.0)
MCV: 87.4 fL (ref 78.0–100.0)
Platelets: 166 10*3/uL (ref 150–400)
RBC: 3.82 MIL/uL — ABNORMAL LOW (ref 3.87–5.11)
RDW: 12.9 % (ref 11.5–15.5)
WBC: 8.4 10*3/uL (ref 4.0–10.5)

## 2015-02-23 MED ORDER — LIDOCAINE HCL (PF) 1 % IJ SOLN
INTRAMUSCULAR | Status: DC | PRN
  Administered 2015-02-23: 4 mL
  Administered 2015-02-23: 6 mL via EPIDURAL

## 2015-02-23 MED ORDER — ZOLPIDEM TARTRATE 5 MG PO TABS: 5.0000 mg | ORAL_TABLET | Freq: Every evening | ORAL | Status: DC | PRN

## 2015-02-23 MED ORDER — SODIUM CHLORIDE 0.9 % IV SOLN: Freq: Once | INTRAVENOUS | Status: DC

## 2015-02-23 MED ORDER — LACTATED RINGERS IV SOLN
500.0000 mL | INTRAVENOUS | Status: DC | PRN
  Administered 2015-02-23 – 2015-02-24 (×3): 500 mL via INTRAVENOUS

## 2015-02-23 MED ORDER — FENTANYL 2.5 MCG/ML BUPIVACAINE 1/10 % EPIDURAL INFUSION (WH - ANES)
14.0000 mL/h | INTRAMUSCULAR | Status: DC | PRN
  Administered 2015-02-23 – 2015-02-24 (×3): 14 mL/h via EPIDURAL
  Filled 2015-02-23 (×2): qty 125

## 2015-02-23 MED ORDER — LACTATED RINGERS IV SOLN
INTRAVENOUS | Status: DC
  Administered 2015-02-23 – 2015-02-24 (×6): via INTRAVENOUS

## 2015-02-23 MED ORDER — DIPHENHYDRAMINE HCL 50 MG/ML IJ SOLN: 12.5000 mg | INTRAMUSCULAR | Status: DC | PRN

## 2015-02-23 MED ORDER — MISOPROSTOL 50MCG HALF TABLET
50.0000 ug | ORAL_TABLET | ORAL | Status: DC
  Administered 2015-02-23 (×2): 50 ug via ORAL
  Filled 2015-02-23 (×2): qty 1
  Filled 2015-02-23: qty 0.5

## 2015-02-23 MED ORDER — LIDOCAINE HCL (PF) 1 % IJ SOLN
30.0000 mL | INTRAMUSCULAR | Status: DC | PRN
  Filled 2015-02-23: qty 30

## 2015-02-23 MED ORDER — OXYCODONE-ACETAMINOPHEN 5-325 MG PO TABS: 2.0000 | ORAL_TABLET | ORAL | Status: DC | PRN

## 2015-02-23 MED ORDER — BUTORPHANOL TARTRATE 1 MG/ML IJ SOLN
1.0000 mg | INTRAMUSCULAR | Status: DC | PRN
  Administered 2015-02-23: 1 mg via INTRAVENOUS
  Filled 2015-02-23: qty 1

## 2015-02-23 MED ORDER — OXYTOCIN BOLUS FROM INFUSION: 500.0000 mL | INTRAVENOUS | Status: DC

## 2015-02-23 MED ORDER — ONDANSETRON HCL 4 MG/2ML IJ SOLN
4.0000 mg | Freq: Four times a day (QID) | INTRAMUSCULAR | Status: DC | PRN
  Administered 2015-02-23 – 2015-02-24 (×3): 4 mg via INTRAVENOUS
  Filled 2015-02-23 (×3): qty 2

## 2015-02-23 MED ORDER — OXYTOCIN 10 UNIT/ML IJ SOLN
2.5000 [IU]/h | INTRAVENOUS | Status: DC
  Filled 2015-02-23: qty 4

## 2015-02-23 MED ORDER — EPHEDRINE 5 MG/ML INJ: 10.0000 mg | INTRAVENOUS | Status: DC | PRN

## 2015-02-23 MED ORDER — FENTANYL 2.5 MCG/ML BUPIVACAINE 1/10 % EPIDURAL INFUSION (WH - ANES)
INTRAMUSCULAR | Status: AC
  Administered 2015-02-24: 14 mL/h via EPIDURAL
  Filled 2015-02-23: qty 125

## 2015-02-23 MED ORDER — OXYCODONE-ACETAMINOPHEN 5-325 MG PO TABS: 1.0000 | ORAL_TABLET | ORAL | Status: DC | PRN

## 2015-02-23 MED ORDER — CITRIC ACID-SODIUM CITRATE 334-500 MG/5ML PO SOLN
30.0000 mL | ORAL | Status: DC | PRN
  Administered 2015-02-24: 30 mL via ORAL
  Filled 2015-02-23: qty 15

## 2015-02-23 MED ORDER — PHENYLEPHRINE 40 MCG/ML (10ML) SYRINGE FOR IV PUSH (FOR BLOOD PRESSURE SUPPORT)
80.0000 ug | PREFILLED_SYRINGE | INTRAVENOUS | Status: AC | PRN
  Administered 2015-02-23 (×3): 80 ug via INTRAVENOUS

## 2015-02-23 MED ORDER — TERBUTALINE SULFATE 1 MG/ML IJ SOLN: 0.2500 mg | Freq: Once | INTRAMUSCULAR | Status: DC | PRN

## 2015-02-23 MED ORDER — ACETAMINOPHEN 325 MG PO TABS: 650.0000 mg | ORAL_TABLET | ORAL | Status: DC | PRN

## 2015-02-23 MED ORDER — MISOPROSTOL 25 MCG QUARTER TABLET
25.0000 ug | ORAL_TABLET | ORAL | Status: DC | PRN
Start: 2015-02-23 — End: 2015-02-23

## 2015-02-23 MED ORDER — PHENYLEPHRINE 40 MCG/ML (10ML) SYRINGE FOR IV PUSH (FOR BLOOD PRESSURE SUPPORT)
PREFILLED_SYRINGE | INTRAVENOUS | Status: AC
  Administered 2015-02-23: 80 ug via INTRAVENOUS
  Filled 2015-02-23: qty 20

## 2015-02-23 NOTE — MAU Provider Note (Signed)
Requested by RN to do speculum exam to rule out ROM.  Perineum  - no fluid but KY jelly noted from previous cervical exam.  Inserted speculum and speculum filled with pink, clear fluid with particles of vernix noted.  Assessment:  Obvious ROM Plan: RN to notify MD

## 2015-02-23 NOTE — MAU Note (Signed)
Pelvic exam preformed by Nolene Bernheimerri Burleson, NP and states pt is ruptured.

## 2015-02-23 NOTE — Progress Notes (Signed)
Dr. Tenny Crawoss notified of pt in MAU with [facility].  Provider states she will put in admission orders.

## 2015-02-23 NOTE — Anesthesia Preprocedure Evaluation (Signed)

## 2015-02-23 NOTE — Anesthesia Procedure Notes (Signed)
Epidural Patient location during procedure: OB  Preanesthetic Checklist Completed: patient identified, site marked, surgical consent, pre-op evaluation, timeout performed, IV checked, risks and benefits discussed and monitors and equipment checked  Epidural Patient position: sitting Prep: site prepped and draped and DuraPrep Patient monitoring: continuous pulse ox and blood pressure Approach: midline Injection technique: LOR air  Needle:  Needle type: Tuohy  Needle gauge: 17 G Needle length: 9 cm and 9 Needle insertion depth: 4 cm Catheter type: closed end flexible Catheter size: 19 Gauge Catheter at skin depth: 10 cm Test dose: negative  Assessment Events: blood not aspirated, injection not painful, no injection resistance, negative IV test and no paresthesia  Additional Notes Dosing of Epidural:  1st dose, through catheter .............................................  Xylocaine 40 mg  2nd dose, through catheter, after waiting 3 minutes.........Xylocaine 60 mg    As each dose occurred, patient was free of IV sx; and patient exhibited no evidence of SA injection.  Patient is more comfortable after epidural dosed. Please see RN's note for documentation of vital signs,and FHR which are stable.  Patient reminded not to try to ambulate with numb legs, and that an RN must be present when she attempts to get up.       

## 2015-02-23 NOTE — MAU Note (Signed)
Pt states that she had a very sharp pain after going to the bathroom at 0200.  When she sat back down a lot of fluid came out.  Fluid has been coming out since.  She has lower abdominal cramping but nothing consistent.  Pt states she is feeling the baby move.  She states it is slightly decreased but the baby's movement are slightly decreased in the morning.

## 2015-02-24 ENCOUNTER — Encounter (HOSPITAL_COMMUNITY)
Admission: AD | Disposition: A | Discharge status: Home / Self Care | Payer: Self-pay | Source: Ambulatory Visit | Attending: Obstetrics and Gynecology

## 2015-02-24 ENCOUNTER — Encounter (HOSPITAL_COMMUNITY): Payer: Self-pay | Admitting: *Deleted

## 2015-02-24 LAB — RPR: RPR Ser Ql: NONREACTIVE

## 2015-02-24 SURGERY — Surgical Case
Anesthesia: Epidural

## 2015-02-24 MED ORDER — PRENATAL MULTIVITAMIN CH
1.0000 | ORAL_TABLET | Freq: Every day | ORAL | Status: DC
  Administered 2015-02-25 – 2015-02-26 (×2): 1 via ORAL
  Filled 2015-02-24 (×2): qty 1

## 2015-02-24 MED ORDER — PHENYLEPHRINE 40 MCG/ML (10ML) SYRINGE FOR IV PUSH (FOR BLOOD PRESSURE SUPPORT)
PREFILLED_SYRINGE | INTRAVENOUS | Status: AC
  Filled 2015-02-24: qty 10

## 2015-02-24 MED ORDER — MENTHOL 3 MG MT LOZG: 1.0000 | LOZENGE | OROMUCOSAL | Status: DC | PRN

## 2015-02-24 MED ORDER — SIMETHICONE 80 MG PO CHEW
80.0000 mg | CHEWABLE_TABLET | ORAL | Status: DC
  Administered 2015-02-24 – 2015-02-26 (×3): 80 mg via ORAL
  Filled 2015-02-24 (×3): qty 1

## 2015-02-24 MED ORDER — DIPHENHYDRAMINE HCL 50 MG/ML IJ SOLN: 12.5000 mg | INTRAMUSCULAR | Status: DC | PRN

## 2015-02-24 MED ORDER — ACETAMINOPHEN 325 MG PO TABS: 650.0000 mg | ORAL_TABLET | ORAL | Status: DC | PRN

## 2015-02-24 MED ORDER — PROMETHAZINE HCL 25 MG/ML IJ SOLN: 6.2500 mg | INTRAMUSCULAR | Status: DC | PRN

## 2015-02-24 MED ORDER — SCOPOLAMINE 1 MG/3DAYS TD PT72: 1.0000 | MEDICATED_PATCH | Freq: Once | TRANSDERMAL | Status: DC

## 2015-02-24 MED ORDER — MEPERIDINE HCL 25 MG/ML IJ SOLN
INTRAMUSCULAR | Status: DC | PRN
  Administered 2015-02-24 (×2): 12.5 mg via INTRAVENOUS

## 2015-02-24 MED ORDER — DIPHENHYDRAMINE HCL 25 MG PO CAPS
25.0000 mg | ORAL_CAPSULE | ORAL | Status: DC | PRN
  Filled 2015-02-24: qty 1

## 2015-02-24 MED ORDER — METHYLERGONOVINE MALEATE 0.2 MG PO TABS: 0.2000 mg | ORAL_TABLET | ORAL | Status: DC | PRN

## 2015-02-24 MED ORDER — DIBUCAINE 1 % RE OINT: 1.0000 "application " | TOPICAL_OINTMENT | RECTAL | Status: DC | PRN

## 2015-02-24 MED ORDER — BUPIVACAINE HCL (PF) 0.25 % IJ SOLN
INTRAMUSCULAR | Status: AC
  Filled 2015-02-24: qty 10

## 2015-02-24 MED ORDER — DIPHENHYDRAMINE HCL 25 MG PO CAPS: 25.0000 mg | ORAL_CAPSULE | Freq: Four times a day (QID) | ORAL | Status: DC | PRN

## 2015-02-24 MED ORDER — FENTANYL CITRATE (PF) 100 MCG/2ML IJ SOLN: 25.0000 ug | INTRAMUSCULAR | Status: DC | PRN

## 2015-02-24 MED ORDER — KETOROLAC TROMETHAMINE 30 MG/ML IJ SOLN
30.0000 mg | Freq: Four times a day (QID) | INTRAMUSCULAR | Status: AC | PRN
  Administered 2015-02-24: 30 mg via INTRAMUSCULAR

## 2015-02-24 MED ORDER — LIDOCAINE-EPINEPHRINE (PF) 2 %-1:200000 IJ SOLN
INTRAMUSCULAR | Status: AC
  Filled 2015-02-24: qty 20

## 2015-02-24 MED ORDER — OXYTOCIN 10 UNIT/ML IJ SOLN: 2.5000 [IU]/h | INTRAVENOUS | Status: AC

## 2015-02-24 MED ORDER — SIMETHICONE 80 MG PO CHEW: 80.0000 mg | CHEWABLE_TABLET | ORAL | Status: DC | PRN

## 2015-02-24 MED ORDER — LACTATED RINGERS IV SOLN
INTRAVENOUS | Status: DC
  Administered 2015-02-24 – 2015-02-25 (×2): via INTRAVENOUS

## 2015-02-24 MED ORDER — LACTATED RINGERS IV SOLN
INTRAVENOUS | Status: DC | PRN
  Administered 2015-02-24 (×3): via INTRAVENOUS

## 2015-02-24 MED ORDER — OXYTOCIN 10 UNIT/ML IJ SOLN
1.0000 m[IU]/min | INTRAVENOUS | Status: DC
  Administered 2015-02-24: 6 m[IU]/min via INTRAVENOUS

## 2015-02-24 MED ORDER — OXYTOCIN 10 UNIT/ML IJ SOLN
INTRAMUSCULAR | Status: AC
Start: 2015-02-24 — End: 2015-02-24
  Filled 2015-02-24: qty 4

## 2015-02-24 MED ORDER — ONDANSETRON HCL 4 MG/2ML IJ SOLN: 4.0000 mg | Freq: Three times a day (TID) | INTRAMUSCULAR | Status: DC | PRN

## 2015-02-24 MED ORDER — KETOROLAC TROMETHAMINE 30 MG/ML IJ SOLN: 30.0000 mg | Freq: Four times a day (QID) | INTRAMUSCULAR | Status: AC | PRN

## 2015-02-24 MED ORDER — MEPERIDINE HCL 25 MG/ML IJ SOLN
INTRAMUSCULAR | Status: AC
  Filled 2015-02-24: qty 1

## 2015-02-24 MED ORDER — PROPOFOL 10 MG/ML IV BOLUS
INTRAVENOUS | Status: AC
  Filled 2015-02-24: qty 20

## 2015-02-24 MED ORDER — MORPHINE SULFATE (PF) 0.5 MG/ML IJ SOLN
INTRAMUSCULAR | Status: DC | PRN
  Administered 2015-02-24: 4 mg via EPIDURAL

## 2015-02-24 MED ORDER — SUCCINYLCHOLINE CHLORIDE 20 MG/ML IJ SOLN
INTRAMUSCULAR | Status: AC
Start: 2015-02-24 — End: 2015-02-24
  Filled 2015-02-24: qty 1

## 2015-02-24 MED ORDER — BUPIVACAINE HCL (PF) 0.25 % IJ SOLN
INTRAMUSCULAR | Status: DC | PRN
  Administered 2015-02-24 (×2): 4 mL via EPIDURAL

## 2015-02-24 MED ORDER — SENNOSIDES-DOCUSATE SODIUM 8.6-50 MG PO TABS
2.0000 | ORAL_TABLET | ORAL | Status: DC
  Administered 2015-02-24 – 2015-02-25 (×2): 2 via ORAL
  Filled 2015-02-24 (×3): qty 2

## 2015-02-24 MED ORDER — SCOPOLAMINE 1 MG/3DAYS TD PT72
MEDICATED_PATCH | TRANSDERMAL | Status: AC
  Filled 2015-02-24: qty 1

## 2015-02-24 MED ORDER — LACTATED RINGERS IV SOLN
INTRAVENOUS | Status: DC | PRN
  Administered 2015-02-24: 16:00:00 via INTRAVENOUS

## 2015-02-24 MED ORDER — WITCH HAZEL-GLYCERIN EX PADS: 1.0000 "application " | MEDICATED_PAD | CUTANEOUS | Status: DC | PRN

## 2015-02-24 MED ORDER — NALBUPHINE HCL 10 MG/ML IJ SOLN: 5.0000 mg | Freq: Once | INTRAMUSCULAR | Status: DC | PRN

## 2015-02-24 MED ORDER — SIMETHICONE 80 MG PO CHEW
80.0000 mg | CHEWABLE_TABLET | Freq: Three times a day (TID) | ORAL | Status: DC
  Administered 2015-02-25 – 2015-02-27 (×7): 80 mg via ORAL
  Filled 2015-02-24 (×6): qty 1

## 2015-02-24 MED ORDER — DEXAMETHASONE SODIUM PHOSPHATE 10 MG/ML IJ SOLN
INTRAMUSCULAR | Status: AC
  Filled 2015-02-24: qty 1

## 2015-02-24 MED ORDER — ONDANSETRON HCL 4 MG/2ML IJ SOLN
INTRAMUSCULAR | Status: DC | PRN
  Administered 2015-02-24: 4 mg via INTRAVENOUS

## 2015-02-24 MED ORDER — DEXAMETHASONE SODIUM PHOSPHATE 10 MG/ML IJ SOLN
INTRAMUSCULAR | Status: DC | PRN
  Administered 2015-02-24: 10 mg via INTRAVENOUS

## 2015-02-24 MED ORDER — TERBUTALINE SULFATE 1 MG/ML IJ SOLN: 0.2500 mg | Freq: Once | INTRAMUSCULAR | Status: DC | PRN

## 2015-02-24 MED ORDER — SODIUM BICARBONATE 8.4 % IV SOLN
INTRAVENOUS | Status: DC | PRN
  Administered 2015-02-24 (×3): 5 mL via EPIDURAL

## 2015-02-24 MED ORDER — NALBUPHINE HCL 10 MG/ML IJ SOLN: 5.0000 mg | INTRAMUSCULAR | Status: DC | PRN

## 2015-02-24 MED ORDER — LANOLIN HYDROUS EX OINT: 1.0000 "application " | TOPICAL_OINTMENT | CUTANEOUS | Status: DC | PRN

## 2015-02-24 MED ORDER — KETOROLAC TROMETHAMINE 30 MG/ML IJ SOLN
INTRAMUSCULAR | Status: AC
  Filled 2015-02-24: qty 1

## 2015-02-24 MED ORDER — OXYTOCIN 10 UNIT/ML IJ SOLN
40.0000 [IU] | INTRAVENOUS | Status: DC | PRN
  Administered 2015-02-24: 40 [IU] via INTRAVENOUS

## 2015-02-24 MED ORDER — ONDANSETRON HCL 4 MG/2ML IJ SOLN
INTRAMUSCULAR | Status: AC
  Filled 2015-02-24: qty 2

## 2015-02-24 MED ORDER — ACETAMINOPHEN 500 MG PO TABS
1000.0000 mg | ORAL_TABLET | Freq: Four times a day (QID) | ORAL | Status: AC
  Administered 2015-02-24 – 2015-02-25 (×3): 1000 mg via ORAL
  Filled 2015-02-24 (×3): qty 2

## 2015-02-24 MED ORDER — TETANUS-DIPHTH-ACELL PERTUSSIS 5-2.5-18.5 LF-MCG/0.5 IM SUSP: 0.5000 mL | Freq: Once | INTRAMUSCULAR | Status: DC

## 2015-02-24 MED ORDER — IBUPROFEN 600 MG PO TABS
600.0000 mg | ORAL_TABLET | Freq: Four times a day (QID) | ORAL | Status: DC
  Administered 2015-02-24 – 2015-02-27 (×10): 600 mg via ORAL
  Filled 2015-02-24 (×10): qty 1

## 2015-02-24 MED ORDER — CEFAZOLIN SODIUM-DEXTROSE 2-3 GM-% IV SOLR
INTRAVENOUS | Status: DC | PRN
  Administered 2015-02-24: 2 g via INTRAVENOUS

## 2015-02-24 MED ORDER — SCOPOLAMINE 1 MG/3DAYS TD PT72
MEDICATED_PATCH | TRANSDERMAL | Status: DC | PRN
  Administered 2015-02-24: 1 via TRANSDERMAL

## 2015-02-24 MED ORDER — MEPERIDINE HCL 25 MG/ML IJ SOLN: 6.2500 mg | INTRAMUSCULAR | Status: DC | PRN

## 2015-02-24 MED ORDER — SODIUM BICARBONATE 8.4 % IV SOLN
INTRAVENOUS | Status: AC
  Filled 2015-02-24: qty 50

## 2015-02-24 MED ORDER — CEFAZOLIN SODIUM-DEXTROSE 2-3 GM-% IV SOLR
INTRAVENOUS | Status: AC
  Filled 2015-02-24: qty 50

## 2015-02-24 MED ORDER — MORPHINE SULFATE (PF) 0.5 MG/ML IJ SOLN
INTRAMUSCULAR | Status: AC
  Filled 2015-02-24: qty 10

## 2015-02-24 MED ORDER — NALOXONE HCL 0.4 MG/ML IJ SOLN: 0.4000 mg | INTRAMUSCULAR | Status: DC | PRN

## 2015-02-24 MED ORDER — NALOXONE HCL 2 MG/2ML IJ SOSY
1.0000 ug/kg/h | PREFILLED_SYRINGE | INTRAVENOUS | Status: DC | PRN
  Filled 2015-02-24: qty 2

## 2015-02-24 MED ORDER — PHENYLEPHRINE HCL 10 MG/ML IJ SOLN
INTRAMUSCULAR | Status: DC | PRN
  Administered 2015-02-24: 80 ug via INTRAVENOUS

## 2015-02-24 MED ORDER — SODIUM CHLORIDE 0.9 % IJ SOLN: 3.0000 mL | INTRAMUSCULAR | Status: DC | PRN

## 2015-02-24 MED ORDER — ZOLPIDEM TARTRATE 5 MG PO TABS: 5.0000 mg | ORAL_TABLET | Freq: Every evening | ORAL | Status: DC | PRN

## 2015-02-24 MED ORDER — METHYLERGONOVINE MALEATE 0.2 MG/ML IJ SOLN: 0.2000 mg | INTRAMUSCULAR | Status: DC | PRN

## 2015-02-24 SURGICAL SUPPLY — 31 items
CLAMP CORD UMBIL (MISCELLANEOUS) IMPLANT
CLOTH BEACON ORANGE TIMEOUT ST (SAFETY) ×3 IMPLANT
DRAPE SHEET LG 3/4 BI-LAMINATE (DRAPES) IMPLANT
DRSG OPSITE POSTOP 4X10 (GAUZE/BANDAGES/DRESSINGS) ×3 IMPLANT
DURAPREP 26ML APPLICATOR (WOUND CARE) ×3 IMPLANT
ELECT REM PT RETURN 9FT ADLT (ELECTROSURGICAL) ×3
ELECTRODE REM PT RTRN 9FT ADLT (ELECTROSURGICAL) ×1 IMPLANT
EXTRACTOR VACUUM M CUP 4 TUBE (SUCTIONS) IMPLANT
EXTRACTOR VACUUM M CUP 4' TUBE (SUCTIONS)
GLOVE BIO SURGEON STRL SZ7 (GLOVE) ×3 IMPLANT
GLOVE BIOGEL PI IND STRL 7.0 (GLOVE) ×1 IMPLANT
GLOVE BIOGEL PI INDICATOR 7.0 (GLOVE) ×2
GOWN STRL REUS W/TWL LRG LVL3 (GOWN DISPOSABLE) ×6 IMPLANT
KIT ABG SYR 3ML LUER SLIP (SYRINGE) IMPLANT
LIQUID BAND (GAUZE/BANDAGES/DRESSINGS) ×3 IMPLANT
NEEDLE HYPO 22GX1.5 SAFETY (NEEDLE) IMPLANT
NEEDLE HYPO 25X5/8 SAFETYGLIDE (NEEDLE) IMPLANT
NS IRRIG 1000ML POUR BTL (IV SOLUTION) ×3 IMPLANT
PACK C SECTION WH (CUSTOM PROCEDURE TRAY) ×3 IMPLANT
PAD OB MATERNITY 4.3X12.25 (PERSONAL CARE ITEMS) ×3 IMPLANT
PENCIL SMOKE EVAC W/HOLSTER (ELECTROSURGICAL) ×3 IMPLANT
RTRCTR C-SECT PINK 25CM LRG (MISCELLANEOUS) ×3 IMPLANT
SUT CHROMIC 1 CTX 36 (SUTURE) ×6 IMPLANT
SUT CHROMIC 2 0 CT 1 (SUTURE) ×3 IMPLANT
SUT PDS AB 0 CTX 60 (SUTURE) ×3 IMPLANT
SUT VIC AB 2-0 CT1 27 (SUTURE) ×2
SUT VIC AB 2-0 CT1 TAPERPNT 27 (SUTURE) ×1 IMPLANT
SUT VIC AB 4-0 KS 27 (SUTURE) IMPLANT
SYR 30ML LL (SYRINGE) IMPLANT
TOWEL OR 17X24 6PK STRL BLUE (TOWEL DISPOSABLE) ×3 IMPLANT
TRAY FOLEY CATH SILVER 14FR (SET/KITS/TRAYS/PACK) ×3 IMPLANT

## 2015-02-24 NOTE — Anesthesia Postprocedure Evaluation (Signed)
Anesthesia Post Note  Patient: Kristen [name]  Procedure(s) Performed: Procedure(s) (LRB): CESAREAN SECTION (N/A)  Patient location during evaluation: PACU Anesthesia Type: Epidural Level of consciousness: oriented and awake and alert Pain management: pain level controlled Vital Signs Assessment: post-procedure vital signs reviewed and stable Respiratory status: spontaneous breathing, respiratory function stable and patient connected to nasal cannula oxygen Cardiovascular status: blood pressure returned to baseline and stable Postop Assessment: no headache, no backache, patient able to bend at knees and epidural receding Anesthetic complications: no    Last Vitals:  Filed Vitals:   02/24/15 1804 02/24/15 1805  BP: 117/70 117/70  Pulse: 61 62  Temp: 37.2 C 37.2 C  Resp: 18 18    Last Pain:  Filed Vitals:   02/24/15 1809  PainSc: 2                  Reino KentJudd, Marny Smethers J

## 2015-02-24 NOTE — Op Note (Signed)
Pre-Operative Diagnosis: 1) 40 week 3 day intrauterine pregnancy 2) failed vacuum extraction 3) meconium-stained fluid 4) occiput posterior presentation Postoperative Diagnosis: same Procedure: emergency low transverse cesarean section via Pfannenstiel skin incision Surgeon: Dr. Waynard Reeds Assistant:none Operative Findings: vigorous female infant in the vertex occiput posterior presentation with Apgar scores of 8 at 1 minute and 9 at 5 minutes. Thick meconium-stained fluid.  Specimen: placenta to pathology EBL: Total I/O In: 3500 [I.V.:3500] Out: 1500 [Urine:750; Blood:750]   Procedure: 35 yo G2P0010 at 40+3 weeks estimated gestational age who presented to L&D for spontaneous rupture of membranes. At that time her water broke her cervix was fingertip. She underwent cervical ripening with oral misoprostal. She progressed quickly from 3 cm to 8 cm however had a slow progression from 8 to complete. She pushed on and off for several hours. It was felt that the baby was presenting in the occiput posterior presentation and that the patient had an adequate pelvis to achieve vaginal delivery based on palpation of the abdomen the baby did not feel large. With adequate maternal pushing the patient was not able to achieve a vaginal delivery and an attempt at a vacuum extraction was recommended. The patient and her husband were verbally consented about the risks benefits and alternatives of a vacuum attempt. Additionally, they were informed that if a vacuum extraction attempt was unsuccessful that we would proceed emergently to the operating room for a cesarean delivery. Following verbal consent the NICU team was called to attend the delivery and attempt at vacuum extraction. With excellent maternal effort the vacuum was applied with 3 separate contractions. 3 pop offs occurred. There appeared to be descent of the vertex with vacuum attempts however the head was felt to elevate back up into the pelvis and the  decision was made to proceed with an emergency cesarean section for failed vacuum attempt. The patient was immediately transferred to the operating room where she was placed in the dorsal supine position in a leftward tilt. The abdomen was prepped with a Betadine splash and the patient was draped sterilely for surgery. Epidural anesthesia was confirmed to be adequate. Scalpel was used to make a Pfannenstiel skin incision which was carried down to the subcutaneous tissue to the fascia. The fascia was incised in the midline and the fascial incision was extended laterally with blunt dissection. The rectus muscles were then separated in the midline with blunt dissection. The abdominal peritoneum was identified entered with hemostat and the incision was extended with blunt dissection. The Alexis retractor was then deployed. A scalpel was then used to make a low transverse incision on the uterus and this was extended laterally with blunt dissection. Upon entry into the uterus thickly meconium-stained fluid emanated as well as the umbilical cord and the right fetal arm. The fetal vertex was then identified, elevated from the pelvis and delivered through the uterine incision followed by the body. The infant was bulb suctioned on the operative field and cried vigorously. The umbilical cord was cut and clamped and the infant was passed to the neonatology team for further resuscitation. The placenta did not easily deliver with bimanual massage and was manually extracted. The low transverse incision on the uterus was repaired with #1 chromic in a running locked fashion followed by second imbricating layer. The peritoneum was then reapproximated with 2-0 Vicryl in a running fashion the rectus muscles were then reapproximated with 2-0 chromic in a running fashion. The fascia was closed with a looped PDS in a running  fashion. The skin was closed with 4-0 Vicryl on a Mellody DanceKeith in a subcuticular fashion and surgical skin glue. All  sponge, lap, needle counts were correct 3. The patient tolerated the procedure well was transferred to the PACU in stable condition following the procedure

## 2015-02-24 NOTE — Progress Notes (Signed)
Patient ID: Haskel KhanAmber M Blossom, female   DOB: October 05, 1980, 35 y.o.   MRN: [id number]  S: Comfortable O: AfVSS FHR 120-130 moderate variability, category 1 tracing. cvx posterior rim toco q2-3  Pt examined and with palpation of the head and attempts to rotate the FHR declined to the 70-80s and then could not be traced. It was determined tha the FSE was dislodged and the FSE was re-applied and the FHR was 110-120 with moderate variability. Palpation of the head showed a small stretchy rim of cervix posteriorly. Thick meconium fluid is present. Patient placed in exaggerated sims on the peanut and will labor for an additional hour and then recheck. Tracing category 1

## 2015-02-24 NOTE — Consult Note (Signed)
Neonatology Note:   Attendance at C-section:   I was asked by Dr. Ross to attend this Stat C/S at term after a failed vacuum extraction. The mother is a G2P0A1 B pos, GBS neg with an uncomplicated pregnancy. ROM 38 hours prior to delivery, fluid with heavy meconium. No fetal distress; baby thought to be OP and failed vacuum extraction. At C/S, infant vigorous with good spontaneous cry and tone. Needed only minimal bulb suctioning. Ap 9/9. Lungs clear to ausc in DR. To CN to care of Pediatrician.  Kristen Darrow C. Mcdonald Reiling, MD 

## 2015-02-24 NOTE — Transfer of Care (Signed)
Immediate Anesthesia Transfer of Care Note  Patient: Kristen [name]  Procedure(s) Performed: Procedure(s): CESAREAN SECTION (N/A)  Patient Location: PACU  Anesthesia Type:Epidural  Level of Consciousness: awake  Airway & Oxygen Therapy: Patient Spontanous Breathing  Post-op Assessment: Report given to RN and Post -op Vital signs reviewed and stable  Post vital signs: stable  Last Vitals:  Filed Vitals:   02/24/15 1530 02/24/15 1533  BP:  108/53  Pulse:  119  Temp: 37.1 C   Resp:  20    Complications: No apparent anesthesia complications

## 2015-02-25 ENCOUNTER — Encounter (HOSPITAL_COMMUNITY): Payer: Self-pay | Admitting: Obstetrics and Gynecology

## 2015-02-25 LAB — CBC
HCT: 23.8 % — ABNORMAL LOW (ref 36.0–46.0)
HEMOGLOBIN: 8.2 g/dL — AB (ref 12.0–15.0)
MCH: 30.4 pg (ref 26.0–34.0)
MCHC: 34.5 g/dL (ref 30.0–36.0)
MCV: 88.1 fL (ref 78.0–100.0)
PLATELETS: 140 10*3/uL — AB (ref 150–400)
RBC: 2.7 MIL/uL — AB (ref 3.87–5.11)
RDW: 13.3 % (ref 11.5–15.5)
WBC: 18.9 10*3/uL — AB (ref 4.0–10.5)

## 2015-02-25 MED ORDER — LACTATED RINGERS IV SOLN
Freq: Once | INTRAVENOUS | Status: AC
Start: 2015-02-25 — End: 2015-02-25
  Administered 2015-02-25: 06:00:00 via INTRAVENOUS

## 2015-02-25 MED ORDER — OXYCODONE-ACETAMINOPHEN 5-325 MG PO TABS
1.0000 | ORAL_TABLET | ORAL | Status: DC | PRN
  Administered 2015-02-25 – 2015-02-27 (×4): 1 via ORAL
  Filled 2015-02-25 (×2): qty 1

## 2015-02-25 MED ORDER — OXYCODONE-ACETAMINOPHEN 5-325 MG PO TABS
2.0000 | ORAL_TABLET | ORAL | Status: DC | PRN
  Administered 2015-02-25 – 2015-02-26 (×3): 2 via ORAL
  Filled 2015-02-25 (×4): qty 2

## 2015-02-25 NOTE — Progress Notes (Signed)
POD#1 Pt without complaints. Lochia mild. Still with cath and IV. Hgb this am is 8.2. Pt is slightly hypotensive but pulse is wnl.  IMP/ S/P C/S stable         Anemic Plan/ Start FeSO4 and follow vitals.

## 2015-02-25 NOTE — Anesthesia Postprocedure Evaluation (Signed)
Anesthesia Post Note  Patient: Kristen [name]  Procedure(s) Performed: Procedure(s) (LRB): CESAREAN SECTION (N/A)  Patient location during evaluation: Mother Baby Anesthesia Type: Epidural Level of consciousness: awake and alert Pain management: pain level controlled Vital Signs Assessment: post-procedure vital signs reviewed and stable Respiratory status: spontaneous breathing Cardiovascular status: blood pressure returned to baseline and stable Postop Assessment: no headache, no backache, patient able to bend at knees, no signs of nausea or vomiting and adequate PO intake Anesthetic complications: no    Last Vitals:  Filed Vitals:   02/25/15 0200 02/25/15 0530  BP: 92/49 97/49  Pulse: 62 54  Temp: 37 C 36.8 C  Resp: 18 18    Last Pain:  Filed Vitals:   02/25/15 0558  PainSc: 0-No pain                 Kien Mirsky

## 2015-02-25 NOTE — Lactation Note (Signed)
This note was copied from the chart of Kristen [name]. Lactation Consultation Note New mom stated that baby has BF several times very well. Baby is sleepy at this time. Attempted to BF STS in football position. Discussed positioning, props, cues, newborn behavior, I&O, cluster feeding, supply and demand. Hand expression taught w/colostrum noted to everted nipples. Mom encouraged to feed baby 8-12 times/24 hours and with feeding cues. Referred to Baby and Me Book in Breastfeeding section Pg. 22-23 for position options and Proper latch demonstration.[facility] brochure given w/resources, support groups and LC services. Noted baby has recessed chin, instructed may have to do a slight gentle chin tug to obtain a deep latch. [facility] Patient Name: Kristen Girard Cootermber Cerveny ZOXWR'UToday's Date: 02/25/2015 Reason for consult: Initial assessment   Maternal Data Has patient been taught Hand Expression?: Yes Does the patient have breastfeeding experience prior to this delivery?: No  Feeding Feeding Type: Breast Fed Length of feed: 0 min  LATCH Score/Interventions Latch: Too sleepy or reluctant, no latch achieved, no sucking elicited. Intervention(s): Skin to skin;Teach feeding cues;Waking techniques Intervention(s): Adjust position;Assist with latch;Breast massage;Breast compression  Audible Swallowing: None  Type of Nipple: Everted at rest and after stimulation  Comfort (Breast/Nipple): Soft / non-tender  Interventions (Mild/moderate discomfort): Hand massage;Hand expression  Hold (Positioning): Assistance needed to correctly position infant at breast and maintain latch. Intervention(s): Breastfeeding basics reviewed;Support Pillows;Position options;Skin to skin  LATCH Score: 5  Lactation Tools Discussed/Used     Consult Status Consult Status: Follow-up Date: 02/25/15 (in pm) Follow-up type: In-patient    Omunique Pederson, Diamond NickelLAURA G 02/25/2015, 3:25 AM

## 2015-02-25 NOTE — Progress Notes (Signed)
MOB was referred for history of depression/anxiety.  Referral is screened out by Clinical Social Worker because none of the following criteria appear to apply: -History of anxiety/depression during this pregnancy, or of post-partum depression. - Diagnosis of anxiety and/or depression within last 3 years or -MOB's symptoms are currently being treated with medication and/or therapy.  CSW completed chart review, and was unable to identify or locate any history of anxiety. It is not listed in her problem list in her OB records, in her current H&P, or in her nursing admission summary.   Please contact the Clinical Social Worker if needs arise or upon MOB request.  

## 2015-02-25 NOTE — Progress Notes (Signed)
Dr Tenny Crawoss called about decreased urinary output. Patients vital signs and assessment information also given. Orders received to to Lactated Ringers bolus of 500 ml.

## 2015-02-25 NOTE — Progress Notes (Signed)
Pt desires circ. Baby has not voided yet

## 2015-02-25 NOTE — Addendum Note (Signed)
Addendum  created 02/25/15 0802 by Janeece Ageeynthia W Dontay Harm, CRNA   Modules edited: Clinical Notes   Clinical Notes:  File: [id number]

## 2015-02-26 MED ORDER — OXYCODONE-ACETAMINOPHEN 5-325 MG PO TABS: 1.0000 | ORAL_TABLET | ORAL | Status: DC | PRN

## 2015-02-26 NOTE — Discharge Summary (Signed)
Obstetric Discharge Summary Reason for Admission: onset of labor Prenatal Procedures: NST Intrapartum Procedures: cesarean: low cervical, transverse Postpartum Procedures: none Complications-Operative and Postpartum: none HEMOGLOBIN  Date Value Ref Range Status  02/25/2015 8.2* 12.0 - 15.0 g/dL Final    Comment:    DELTA CHECK NOTED REPEATED TO VERIFY    HCT  Date Value Ref Range Status  02/25/2015 23.8* 36.0 - 46.0 % Final     Discharge Diagnoses: Term Pregnancy-delivered  Discharge Information: Date: 02/26/2015 Activity: pelvic rest Diet: routine Medications: Iron and Percocet Condition: stable Instructions: refer to practice specific booklet Discharge to: home Follow-up Information    Follow up with Almon HerculesOSS,KENDRA H., MD In 4 weeks.   Specialty:  Obstetrics and Gynecology   Contact information:   [address] [place] [address] [phone]       Newborn Data: Live born female  Birth Weight: 8 lb 10.1 oz (3915 g) APGAR: 9, 9  Home with mother.  Vanita Cannell A 02/26/2015, 7:37 AM

## 2015-02-26 NOTE — Progress Notes (Signed)
  Patient is eating, ambulating, voiding.  Pain control is good.  Filed Vitals:   02/25/15 0918 02/25/15 1407 02/25/15 1823 02/26/15 0552  BP: 90/53 99/58 108/64 114/63  Pulse: 54 72 65 64  Temp: 98.3 F (36.8 C) 98.2 F (36.8 C) 98.4 F (36.9 C) 98.5 F (36.9 C)  TempSrc: Oral Axillary Axillary Oral  Resp: 18 18 18 18   Height:      Weight:      SpO2: 97%   99%    lungs:   clear to auscultation cor:    RRR Abdomen:  soft, appropriate tenderness, incisions intact and without erythema or exudate ex:    no cords   Lab Results  Component Value Date   WBC 18.9* 02/25/2015   HGB 8.2* 02/25/2015   HCT 23.8* 02/25/2015   MCV 88.1 02/25/2015   PLT 140* 02/25/2015    --/--/B POS (01/07 1057)/RI  A/P    Post operative day 2.  Routine post op and postpartum care.  Expect d/c routine.  Percocet for pain control.

## 2015-02-26 NOTE — Progress Notes (Signed)
Dr. Henderson CloudHorvath stated while putting discharge orders in, that if patient decides not to go home today, to cancel discharge orders. Patient decided she did not want to go home early today. Cancelled order for today. Earl Galasborne, Linda HedgesStefanie TregoHudspeth

## 2015-02-27 ENCOUNTER — Telehealth (HOSPITAL_COMMUNITY): Payer: Self-pay | Admitting: *Deleted

## 2015-02-27 LAB — TYPE AND SCREEN
ABO/RH(D): B POS
ANTIBODY SCREEN: POSITIVE
DAT, IgG: NEGATIVE
PT AG TYPE: NEGATIVE
UNIT DIVISION: 0
UNIT DIVISION: 0
Unit division: 0

## 2015-02-27 NOTE — Telephone Encounter (Signed)
Preadmission screen  

## 2015-02-27 NOTE — Discharge Summary (Signed)
  See d/c summary from 02/26/2015.  Pt decided not to go home.  No new problems.  D/C with Henderson CloudHorvath recommendations.

## 2015-02-27 NOTE — Lactation Note (Signed)
This note was copied from the chart of Kristen [name]. Lactation Consultation Note  Mother's breasts filling.  Right breast engorged. Taught mother how to use hand pump to help soften breast  to latch. Baby latched in football hold.  Sucks and swallows observed. Provided mother w/ ice packs and discussed engorgement care including breast massage. Discussed pumping strategies for going back to work. Praised parents for their efforts.  FOB very helpful.    Patient Name: Kristen Girard Cootermber Virag QIONG'EToday's Date: 02/27/2015 Reason for consult: Follow-up assessment   Maternal Data    Feeding Feeding Type: Breast Fed Length of feed: 30 min  LATCH Score/Interventions Latch: Grasps breast easily, tongue down, lips flanged, rhythmical sucking.  Audible Swallowing: Spontaneous and intermittent  Type of Nipple: Everted at rest and after stimulation  Comfort (Breast/Nipple): Engorged, cracked, bleeding, large blisters, severe discomfort Problem noted: Engorgment Intervention(s): Ice;Hand expression;Reverse pressure  Problem noted: Filling Interventions (Mild/moderate discomfort): Hand expression;Hand massage  Hold (Positioning): No assistance needed to correctly position infant at breast.  LATCH Score: 8  Lactation Tools Discussed/Used     Consult Status Consult Status: Complete    Hardie PulleyBerkelhammer, Ruth Boschen 02/27/2015, 9:31 AM

## 2015-03-01 ENCOUNTER — Inpatient Hospital Stay (HOSPITAL_COMMUNITY): Admission: RE | Admit: 2015-03-01 | Payer: Commercial Indemnity | Source: Ambulatory Visit

## 2015-03-18 NOTE — H&P (Signed)
Kristen [name] is a 35 y.o. female presenting for leaking fluid  35 yo G2P0010 presents for leaking fluid and was admitted for SROM.  History OB History as of 03/11/15    Gravida Para Term Preterm AB TAB SAB Ectopic Multiple Living   0 1     Past Medical History  Diagnosis Date  . Medical history non-contributory    Past Surgical History  Procedure Laterality Date  . Laparoscopy Right 10/31/2012    Procedure: LAPAROSCOPY OPERATIVE  SALPINGECTOMY;  Surgeon: Genia Del, MD;  Location: [facility];  Service: Gynecology;  Laterality: Right;  . Chromopertubation  10/31/2012    Procedure: CHROMOPERTUBATION;  Surgeon: Genia Del, MD;  Location: [facility];  Service: Gynecology;;  . Salpingoophorectomy  10/31/2012    Procedure: SALPINGO OOPHORECTOMY;  Surgeon: Genia Del, MD;  Location: [facility];  Service: Gynecology;;  . Ablation on endometriosis N/A 10/31/2012    Procedure: ABLATION ON ENDOMETRIOSIS;  Surgeon: Genia Del, MD;  Location: [facility];  Service: Gynecology;  Laterality: N/A;  . Wisdom tooth extraction    . Cesarean section N/A 02/24/2015    Procedure: CESAREAN SECTION;  Surgeon: Waynard Reeds, MD;  Location: [facility];  Service: Obstetrics;  Laterality: N/A;   Family History: family history is not on file. Social History:  reports that she has never smoked. She has never used smokeless tobacco. She reports that she does not drink alcohol or use illicit drugs.   Prenatal Transfer Tool  Maternal Diabetes: No Genetic Screening: Normal Maternal Ultrasounds/Referrals: Normal Fetal Ultrasounds or other Referrals:  None Maternal Substance Abuse:  No Significant Maternal Medications:  None Significant Maternal Lab Results:  None Other Comments:  None  ROS  Dilation: 10 Effacement (%): 100 Station: 0, +1 Exam by:: Henderson Newcomer, RN Blood pressure 120/68, pulse 68, temperature 98.3 F (36.8 C), temperature source Oral, resp. rate 18, height  (1.702  m), weight 75.297 kg (166 lb), last menstrual period 05/17/2014, SpO2 99 %, unknown if currently breastfeeding. Exam Physical Exam  Prenatal labs: ABO, Rh: --/--/B POS (01/07 1057) Antibody: POS (01/07 1057) Rubella: Immune (06/22 0000) RPR: Non Reactive (01/07 1057)  HBsAg: Negative (06/22 0000)  HIV: Non-reactive (06/22 0000)  GBS: Negative (12/07 0000)   Assessment/Plan: 1) Admit 2) Oral misoprostal d/t SROM and unfavorable cervix 3) epidural on request   Brynlynn Walko H. 03/18/2015, 1:04 PM

## 2016-02-28 IMAGING — RF DG HYSTEROGRAM
3 series · 3 of 3 positions shown · IV contrast (omnipaque)
Comparison: None.

FLUOROSCOPY TIME:  54 seconds

CLINICAL DATA: History of ectopic.  Infertility.

EXAM:
HYSTEROSALPINGOGRAM
TECHNIQUE: Following cleansing of the cervix and vagina with Betadine solution,
a hysterosalpingogram was performed using a 5-French
hysterosalpingogram catheter and Omnipaque 300 contrast. The patient
tolerated the examination without difficulty.

[Series 1: run · 1 of 1 slices shown (1 of 3)]
[im 1/1]
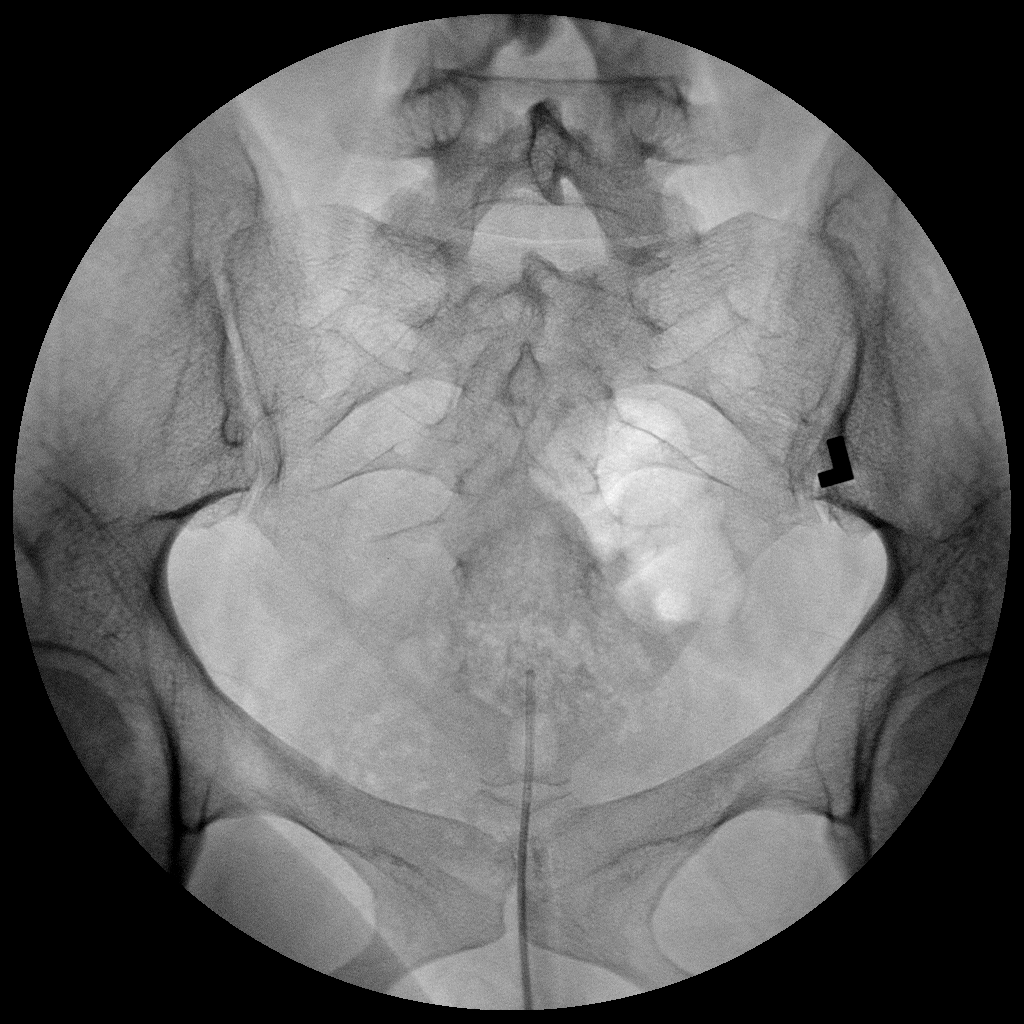

[Series 2: run · 1 of 1 slices shown (2 of 3)]
[im 1/1]
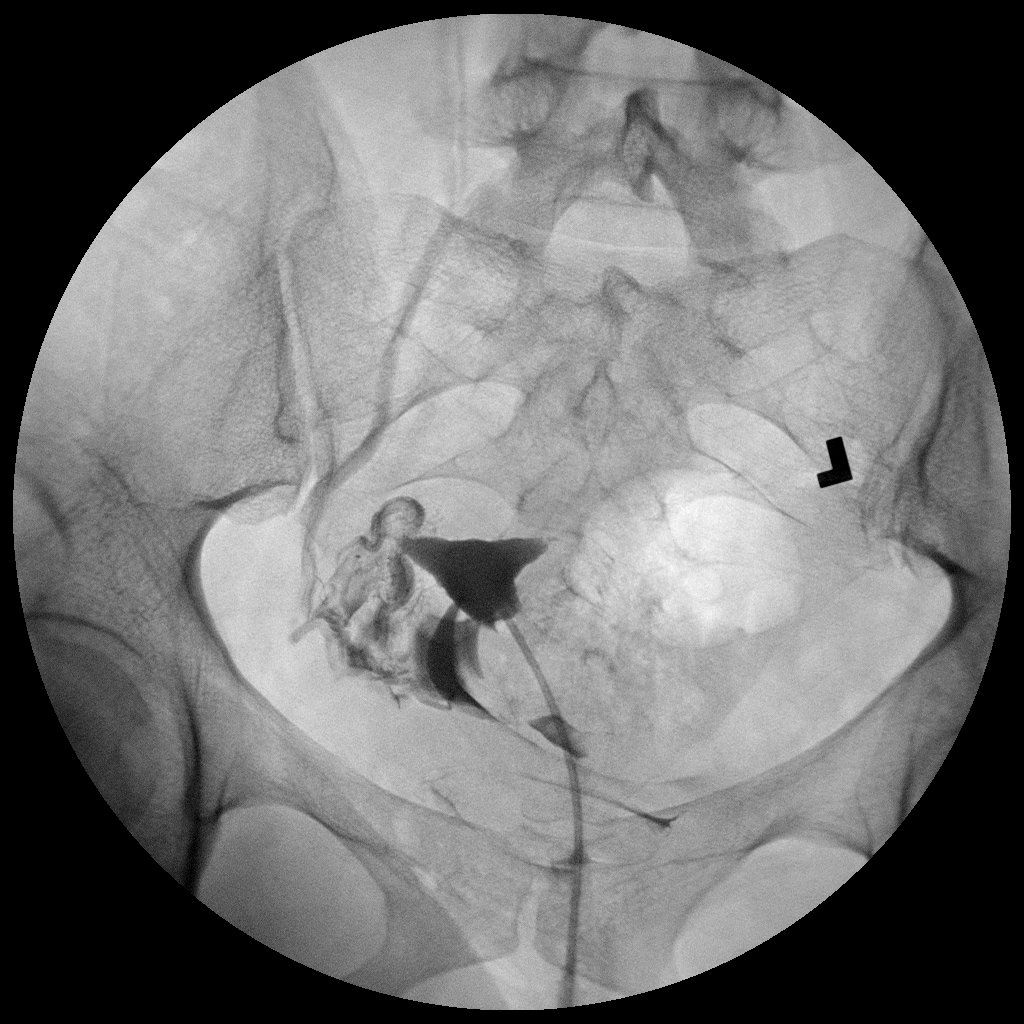

[Series 3: run · 1 of 1 slices shown (3 of 3)]
[im 1/1]
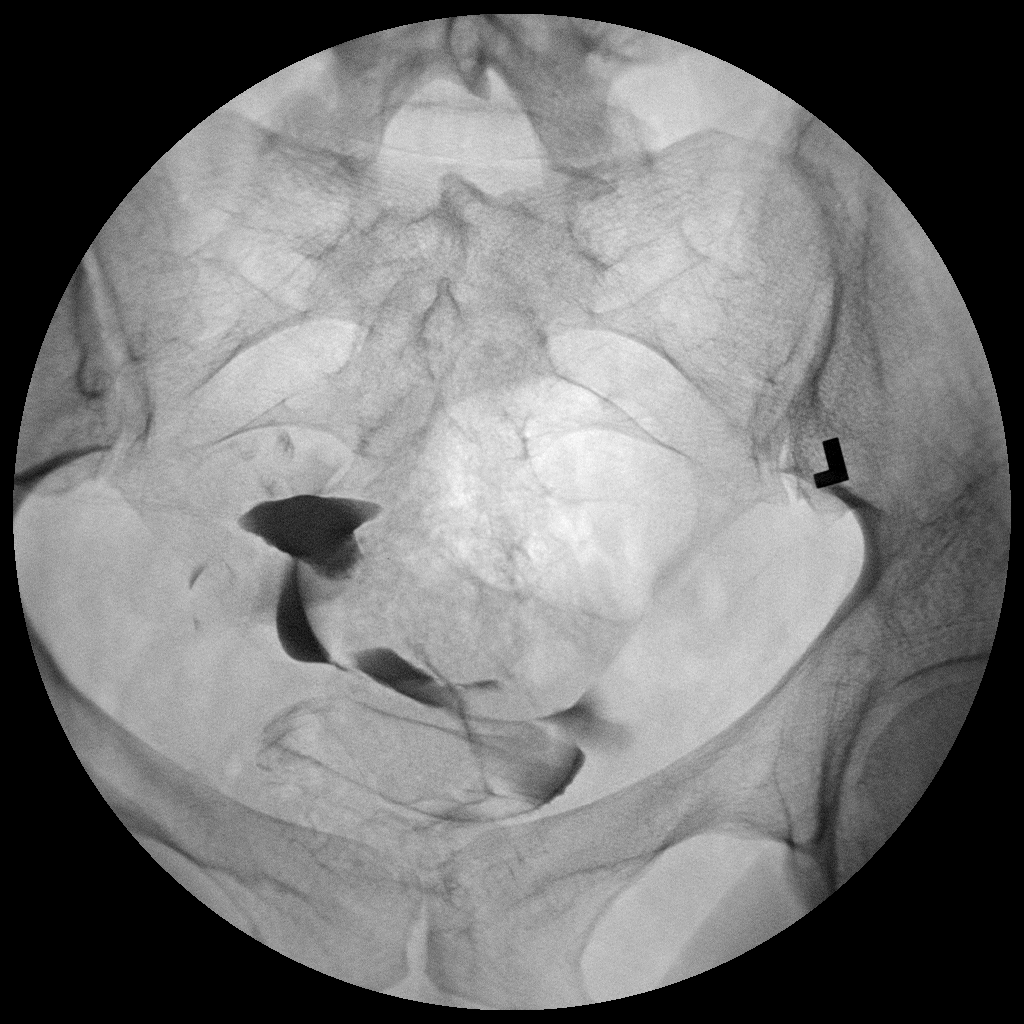

[3 of 3 positions shown; findings below may reference images not displayed]

FINDINGS: The endometrial cavity is normal in appearance and contour. No signs
of mullerian duct anomaly.

Opacification of right fallopian tube is seen. Left tube not
visualized. Intraperitoneal spill of contrast from both fallopian
tubes is demonstrated. Venous intravasation noted.
IMPRESSION: 1. Normal appearance of the uterus. The right fallopian tube is
patent.
2. Nonvisualization of the left fallopian tube.

## 2017-02-17 LAB — OB RESULTS CONSOLE RUBELLA ANTIBODY, IGM: Rubella: IMMUNE

## 2017-02-17 LAB — OB RESULTS CONSOLE HEPATITIS B SURFACE ANTIGEN: Hepatitis B Surface Ag: NEGATIVE

## 2017-02-17 LAB — OB RESULTS CONSOLE ABO/RH: RH Type: POSITIVE

## 2017-02-17 LAB — OB RESULTS CONSOLE GC/CHLAMYDIA
CHLAMYDIA, DNA PROBE: NEGATIVE
GC PROBE AMP, GENITAL: NEGATIVE

## 2017-02-17 LAB — OB RESULTS CONSOLE ANTIBODY SCREEN: Antibody Screen: POSITIVE

## 2017-02-17 LAB — OB RESULTS CONSOLE HIV ANTIBODY (ROUTINE TESTING): HIV: NONREACTIVE

## 2017-02-17 LAB — OB RESULTS CONSOLE RPR: RPR: NONREACTIVE

## 2017-06-22 ENCOUNTER — Other Ambulatory Visit: Payer: Self-pay | Admitting: Obstetrics and Gynecology

## 2017-07-20 LAB — OB RESULTS CONSOLE GBS: STREP GROUP B AG: NEGATIVE

## 2017-08-02 ENCOUNTER — Telehealth (HOSPITAL_COMMUNITY): Payer: Self-pay | Admitting: *Deleted

## 2017-08-02 ENCOUNTER — Encounter (HOSPITAL_COMMUNITY): Payer: Self-pay | Admitting: *Deleted

## 2017-08-02 NOTE — Telephone Encounter (Signed)
Preadmission screen  

## 2017-08-03 ENCOUNTER — Telehealth (HOSPITAL_COMMUNITY): Payer: Self-pay | Admitting: *Deleted

## 2017-08-03 ENCOUNTER — Encounter (HOSPITAL_COMMUNITY): Payer: Self-pay

## 2017-08-03 ENCOUNTER — Other Ambulatory Visit: Payer: Self-pay | Admitting: Obstetrics and Gynecology

## 2017-08-03 NOTE — Telephone Encounter (Signed)
Preadmission screen  

## 2017-08-16 ENCOUNTER — Encounter (HOSPITAL_COMMUNITY)
Admission: RE | Admit: 2017-08-16 | Discharge: 2017-08-16 | Disposition: A | Discharge status: Home / Self Care | Payer: 59 | Source: Ambulatory Visit | Attending: Obstetrics and Gynecology | Admitting: Obstetrics and Gynecology

## 2017-08-16 HISTORY — DX: Female infertility, unspecified: N97.9

## 2017-08-16 HISTORY — DX: Hypothyroidism, unspecified: E03.9

## 2017-08-16 LAB — CBC
HCT: 32.4 % — ABNORMAL LOW (ref 36.0–46.0)
Hemoglobin: 11 g/dL — ABNORMAL LOW (ref 12.0–15.0)
MCH: 30.1 pg (ref 26.0–34.0)
MCHC: 34 g/dL (ref 30.0–36.0)
MCV: 88.5 fL (ref 78.0–100.0)
PLATELETS: 172 10*3/uL (ref 150–400)
RBC: 3.66 MIL/uL — AB (ref 3.87–5.11)
RDW: 12.7 % (ref 11.5–15.5)
WBC: 6.5 10*3/uL (ref 4.0–10.5)

## 2017-08-16 NOTE — Patient Instructions (Signed)
Kristen [name]  08/16/2017   Your procedure is scheduled on:  08/17/2017  [facility]45 AM.  Pick up the phone at the desk and dial [phone]  Call this number if you have problems the morning of surgery:[phone]  Remember:   Do not eat food:(After Midnight) Desps de medianoche.  Do not drink clear liquids: (After Midnight) Desps de medianoche.  Take these medicines the morning of surgery with A SIP OF WATER: synthroid   Do not wear jewelry, make-up or nail polish.  Do not wear lotions, powders, or perfumes. Do not wear deodorant.  Do not shave 48 hours prior to surgery.  Do not bring valuables to the hospital.  [facility] is not   responsible for any belongings or valuables brought to the hospital.  Contacts, dentures or bridgework may not be worn into surgery.  Leave suitcase in the car. After surgery it may be brought to your room.  For patients admitted to the hospital, checkout time is 11:00 AM the day of              discharge.    N/A   Please read over the following fact sheets that you were given:   Surgical Site Infection Prevention

## 2017-08-17 ENCOUNTER — Inpatient Hospital Stay (HOSPITAL_COMMUNITY): Payer: 59 | Admitting: Anesthesiology

## 2017-08-17 ENCOUNTER — Encounter (HOSPITAL_COMMUNITY): Payer: Self-pay | Admitting: *Deleted

## 2017-08-17 ENCOUNTER — Inpatient Hospital Stay (HOSPITAL_COMMUNITY)
Admission: AD | Admit: 2017-08-17 | Discharge: 2017-08-19 | DRG: 788 | Disposition: A | Discharge status: Home / Self Care | Payer: 59 | Attending: Obstetrics and Gynecology | Admitting: Obstetrics and Gynecology

## 2017-08-17 ENCOUNTER — Encounter (HOSPITAL_COMMUNITY)
Admission: AD | Disposition: A | Discharge status: Home / Self Care | Payer: Self-pay | Source: Home / Self Care | Attending: Obstetrics and Gynecology

## 2017-08-17 ENCOUNTER — Other Ambulatory Visit: Payer: Self-pay

## 2017-08-17 DIAGNOSIS — D649 Anemia, unspecified: Secondary | ICD-10-CM | POA: Diagnosis present

## 2017-08-17 DIAGNOSIS — O9902 Anemia complicating childbirth: Secondary | ICD-10-CM | POA: Diagnosis present

## 2017-08-17 DIAGNOSIS — O34211 Maternal care for low transverse scar from previous cesarean delivery: Principal | ICD-10-CM | POA: Diagnosis present

## 2017-08-17 DIAGNOSIS — Z3A39 39 weeks gestation of pregnancy: Secondary | ICD-10-CM

## 2017-08-17 LAB — RPR: RPR Ser Ql: NONREACTIVE

## 2017-08-17 SURGERY — Surgical Case
Anesthesia: Spinal

## 2017-08-17 MED ORDER — OXYCODONE HCL 5 MG/5ML PO SOLN: 5.0000 mg | Freq: Once | ORAL | Status: DC | PRN

## 2017-08-17 MED ORDER — FENTANYL CITRATE (PF) 100 MCG/2ML IJ SOLN
INTRAMUSCULAR | Status: AC
  Filled 2017-08-17: qty 2

## 2017-08-17 MED ORDER — TETANUS-DIPHTH-ACELL PERTUSSIS 5-2.5-18.5 LF-MCG/0.5 IM SUSP: 0.5000 mL | Freq: Once | INTRAMUSCULAR | Status: DC

## 2017-08-17 MED ORDER — SODIUM CHLORIDE 0.9 % IR SOLN
Status: DC | PRN
  Administered 2017-08-17: 1

## 2017-08-17 MED ORDER — OXYTOCIN 10 UNIT/ML IJ SOLN
INTRAVENOUS | Status: DC | PRN
  Administered 2017-08-17: 40 [IU] via INTRAVENOUS

## 2017-08-17 MED ORDER — DIBUCAINE 1 % RE OINT: 1.0000 "application " | TOPICAL_OINTMENT | RECTAL | Status: DC | PRN

## 2017-08-17 MED ORDER — ONDANSETRON HCL 4 MG/2ML IJ SOLN: 4.0000 mg | Freq: Three times a day (TID) | INTRAMUSCULAR | Status: DC | PRN

## 2017-08-17 MED ORDER — DIPHENHYDRAMINE HCL 25 MG PO CAPS: 25.0000 mg | ORAL_CAPSULE | Freq: Four times a day (QID) | ORAL | Status: DC | PRN

## 2017-08-17 MED ORDER — LEVOTHYROXINE SODIUM 88 MCG PO TABS
88.0000 ug | ORAL_TABLET | Freq: Every day | ORAL | Status: DC
Start: 2017-08-18 — End: 2017-08-19
  Administered 2017-08-18 – 2017-08-19 (×2): 88 ug via ORAL
  Filled 2017-08-17 (×2): qty 1

## 2017-08-17 MED ORDER — NALOXONE HCL 4 MG/10ML IJ SOLN
1.0000 ug/kg/h | INTRAVENOUS | Status: DC | PRN
  Filled 2017-08-17: qty 5

## 2017-08-17 MED ORDER — VITAMIN K1 1 MG/0.5ML IJ SOLN
INTRAMUSCULAR | Status: AC
  Filled 2017-08-17: qty 0.5

## 2017-08-17 MED ORDER — NALBUPHINE HCL 10 MG/ML IJ SOLN: 5.0000 mg | INTRAMUSCULAR | Status: DC | PRN

## 2017-08-17 MED ORDER — OXYTOCIN 40 UNITS IN LACTATED RINGERS INFUSION - SIMPLE MED: 2.5000 [IU]/h | INTRAVENOUS | Status: AC

## 2017-08-17 MED ORDER — ONDANSETRON HCL 4 MG/2ML IJ SOLN
INTRAMUSCULAR | Status: DC | PRN
  Administered 2017-08-17: 4 mg via INTRAVENOUS

## 2017-08-17 MED ORDER — ACETAMINOPHEN 325 MG PO TABS: 650.0000 mg | ORAL_TABLET | ORAL | Status: DC | PRN

## 2017-08-17 MED ORDER — FAMOTIDINE 20 MG PO TABS
20.0000 mg | ORAL_TABLET | Freq: Once | ORAL | Status: AC
  Administered 2017-08-17: 20 mg via ORAL
  Filled 2017-08-17: qty 1

## 2017-08-17 MED ORDER — NALBUPHINE HCL 10 MG/ML IJ SOLN: 5.0000 mg | Freq: Once | INTRAMUSCULAR | Status: DC | PRN

## 2017-08-17 MED ORDER — PHENYLEPHRINE 8 MG IN D5W 100 ML (0.08MG/ML) PREMIX OPTIME
INJECTION | INTRAVENOUS | Status: DC | PRN
  Administered 2017-08-17: 70 ug/min via INTRAVENOUS

## 2017-08-17 MED ORDER — OXYCODONE-ACETAMINOPHEN 5-325 MG PO TABS
1.0000 | ORAL_TABLET | ORAL | Status: DC | PRN
  Administered 2017-08-18: 1 via ORAL

## 2017-08-17 MED ORDER — DEXAMETHASONE SODIUM PHOSPHATE 10 MG/ML IJ SOLN
INTRAMUSCULAR | Status: AC
  Filled 2017-08-17: qty 1

## 2017-08-17 MED ORDER — SIMETHICONE 80 MG PO CHEW: 80.0000 mg | CHEWABLE_TABLET | ORAL | Status: DC | PRN

## 2017-08-17 MED ORDER — LACTATED RINGERS IV SOLN
INTRAVENOUS | Status: DC | PRN
  Administered 2017-08-17: 12:00:00 via INTRAVENOUS

## 2017-08-17 MED ORDER — NALOXONE HCL 0.4 MG/ML IJ SOLN: 0.4000 mg | INTRAMUSCULAR | Status: DC | PRN

## 2017-08-17 MED ORDER — KETOROLAC TROMETHAMINE 30 MG/ML IJ SOLN: 30.0000 mg | Freq: Four times a day (QID) | INTRAMUSCULAR | Status: AC | PRN

## 2017-08-17 MED ORDER — FENTANYL CITRATE (PF) 100 MCG/2ML IJ SOLN
25.0000 ug | INTRAMUSCULAR | Status: DC | PRN
  Administered 2017-08-17: 50 ug via INTRAVENOUS
  Administered 2017-08-17: 25 ug via INTRAVENOUS

## 2017-08-17 MED ORDER — SCOPOLAMINE 1 MG/3DAYS TD PT72
1.0000 | MEDICATED_PATCH | Freq: Once | TRANSDERMAL | Status: DC
  Filled 2017-08-17: qty 1

## 2017-08-17 MED ORDER — MORPHINE SULFATE (PF) 0.5 MG/ML IJ SOLN
INTRAMUSCULAR | Status: AC
  Filled 2017-08-17: qty 10

## 2017-08-17 MED ORDER — FENTANYL CITRATE (PF) 100 MCG/2ML IJ SOLN
INTRAMUSCULAR | Status: DC | PRN
  Administered 2017-08-17: 10 ug via INTRATHECAL

## 2017-08-17 MED ORDER — IBUPROFEN 600 MG PO TABS
600.0000 mg | ORAL_TABLET | Freq: Four times a day (QID) | ORAL | Status: DC
  Administered 2017-08-17 – 2017-08-19 (×6): 600 mg via ORAL
  Filled 2017-08-17 (×6): qty 1

## 2017-08-17 MED ORDER — MORPHINE SULFATE (PF) 0.5 MG/ML IJ SOLN
INTRAMUSCULAR | Status: DC | PRN
  Administered 2017-08-17: .2 mg via INTRATHECAL

## 2017-08-17 MED ORDER — MENTHOL 3 MG MT LOZG: 1.0000 | LOZENGE | OROMUCOSAL | Status: DC | PRN

## 2017-08-17 MED ORDER — OXYTOCIN 10 UNIT/ML IJ SOLN
INTRAMUSCULAR | Status: AC
  Filled 2017-08-17: qty 4

## 2017-08-17 MED ORDER — MEPERIDINE HCL 25 MG/ML IJ SOLN: 6.2500 mg | INTRAMUSCULAR | Status: DC | PRN

## 2017-08-17 MED ORDER — PRENATAL MULTIVITAMIN CH
1.0000 | ORAL_TABLET | Freq: Every day | ORAL | Status: DC
  Administered 2017-08-18: 1 via ORAL
  Filled 2017-08-17: qty 1

## 2017-08-17 MED ORDER — WITCH HAZEL-GLYCERIN EX PADS: 1.0000 "application " | MEDICATED_PAD | CUTANEOUS | Status: DC | PRN

## 2017-08-17 MED ORDER — SIMETHICONE 80 MG PO CHEW
80.0000 mg | CHEWABLE_TABLET | Freq: Three times a day (TID) | ORAL | Status: DC
  Administered 2017-08-17 – 2017-08-19 (×5): 80 mg via ORAL
  Filled 2017-08-17 (×3): qty 1

## 2017-08-17 MED ORDER — ONDANSETRON HCL 4 MG/2ML IJ SOLN
INTRAMUSCULAR | Status: AC
  Filled 2017-08-17: qty 2

## 2017-08-17 MED ORDER — DIPHENHYDRAMINE HCL 25 MG PO CAPS: 25.0000 mg | ORAL_CAPSULE | ORAL | Status: DC | PRN

## 2017-08-17 MED ORDER — ERYTHROMYCIN 5 MG/GM OP OINT
TOPICAL_OINTMENT | OPHTHALMIC | Status: AC
  Filled 2017-08-17: qty 1

## 2017-08-17 MED ORDER — LACTATED RINGERS IV SOLN
INTRAVENOUS | Status: DC
  Administered 2017-08-17: via INTRAVENOUS

## 2017-08-17 MED ORDER — BUPIVACAINE IN DEXTROSE 0.75-8.25 % IT SOLN
INTRATHECAL | Status: DC | PRN
  Administered 2017-08-17: 12 mg via INTRATHECAL

## 2017-08-17 MED ORDER — PROMETHAZINE HCL 25 MG/ML IJ SOLN: 6.2500 mg | INTRAMUSCULAR | Status: DC | PRN

## 2017-08-17 MED ORDER — KETOROLAC TROMETHAMINE 30 MG/ML IJ SOLN
INTRAMUSCULAR | Status: AC
  Filled 2017-08-17: qty 1

## 2017-08-17 MED ORDER — SCOPOLAMINE 1 MG/3DAYS TD PT72
1.0000 | MEDICATED_PATCH | Freq: Once | TRANSDERMAL | Status: DC
  Administered 2017-08-17: 1.5 mg via TRANSDERMAL
  Filled 2017-08-17: qty 1

## 2017-08-17 MED ORDER — OXYCODONE-ACETAMINOPHEN 5-325 MG PO TABS
2.0000 | ORAL_TABLET | ORAL | Status: DC | PRN
  Administered 2017-08-18 – 2017-08-19 (×4): 2 via ORAL
  Filled 2017-08-17 (×4): qty 2

## 2017-08-17 MED ORDER — COCONUT OIL OIL
1.0000 | TOPICAL_OIL | Status: DC | PRN
Start: 2017-08-17 — End: 2017-08-19

## 2017-08-17 MED ORDER — PHENYLEPHRINE 8 MG IN D5W 100 ML (0.08MG/ML) PREMIX OPTIME
INJECTION | INTRAVENOUS | Status: AC
  Filled 2017-08-17: qty 100

## 2017-08-17 MED ORDER — ZOLPIDEM TARTRATE 5 MG PO TABS: 5.0000 mg | ORAL_TABLET | Freq: Every evening | ORAL | Status: DC | PRN

## 2017-08-17 MED ORDER — LACTATED RINGERS IV SOLN
INTRAVENOUS | Status: DC
  Administered 2017-08-17 (×3): via INTRAVENOUS

## 2017-08-17 MED ORDER — SENNOSIDES-DOCUSATE SODIUM 8.6-50 MG PO TABS
2.0000 | ORAL_TABLET | ORAL | Status: DC
  Administered 2017-08-18 (×2): 2 via ORAL
  Filled 2017-08-17 (×2): qty 2

## 2017-08-17 MED ORDER — OXYCODONE HCL 5 MG PO TABS: 5.0000 mg | ORAL_TABLET | Freq: Once | ORAL | Status: DC | PRN

## 2017-08-17 MED ORDER — SOD CITRATE-CITRIC ACID 500-334 MG/5ML PO SOLN
30.0000 mL | Freq: Once | ORAL | Status: AC
  Administered 2017-08-17: 30 mL via ORAL
  Filled 2017-08-17: qty 15

## 2017-08-17 MED ORDER — SODIUM CHLORIDE 0.9% FLUSH: 3.0000 mL | INTRAVENOUS | Status: DC | PRN

## 2017-08-17 MED ORDER — DEXAMETHASONE SODIUM PHOSPHATE 4 MG/ML IJ SOLN
INTRAMUSCULAR | Status: DC | PRN
  Administered 2017-08-17: 8 mg via INTRAVENOUS

## 2017-08-17 MED ORDER — CEFAZOLIN SODIUM-DEXTROSE 2-4 GM/100ML-% IV SOLN
2.0000 g | INTRAVENOUS | Status: AC
  Administered 2017-08-17: 2 g via INTRAVENOUS
  Filled 2017-08-17: qty 100

## 2017-08-17 MED ORDER — DIPHENHYDRAMINE HCL 50 MG/ML IJ SOLN: 12.5000 mg | INTRAMUSCULAR | Status: DC | PRN

## 2017-08-17 MED ORDER — KETOROLAC TROMETHAMINE 30 MG/ML IJ SOLN
30.0000 mg | Freq: Four times a day (QID) | INTRAMUSCULAR | Status: AC | PRN
  Administered 2017-08-17: 30 mg via INTRAMUSCULAR

## 2017-08-17 MED ORDER — PHENYLEPHRINE 40 MCG/ML (10ML) SYRINGE FOR IV PUSH (FOR BLOOD PRESSURE SUPPORT)
PREFILLED_SYRINGE | INTRAVENOUS | Status: AC
  Filled 2017-08-17: qty 10

## 2017-08-17 MED ORDER — SIMETHICONE 80 MG PO CHEW
80.0000 mg | CHEWABLE_TABLET | ORAL | Status: DC
  Administered 2017-08-18 (×2): 80 mg via ORAL
  Filled 2017-08-17 (×2): qty 1

## 2017-08-17 SURGICAL SUPPLY — 34 items
BENZOIN TINCTURE PRP APPL 2/3 (GAUZE/BANDAGES/DRESSINGS) ×3 IMPLANT
CHLORAPREP W/TINT 26ML (MISCELLANEOUS) ×3 IMPLANT
CLAMP CORD UMBIL (MISCELLANEOUS) IMPLANT
CLOSURE STERI STRIP 1/2 X4 (GAUZE/BANDAGES/DRESSINGS) ×3 IMPLANT
CLOTH BEACON ORANGE TIMEOUT ST (SAFETY) ×3 IMPLANT
DRSG OPSITE POSTOP 4X10 (GAUZE/BANDAGES/DRESSINGS) ×3 IMPLANT
ELECT REM PT RETURN 9FT ADLT (ELECTROSURGICAL) ×3
ELECTRODE REM PT RTRN 9FT ADLT (ELECTROSURGICAL) ×1 IMPLANT
EXTRACTOR VACUUM M CUP 4 TUBE (SUCTIONS) IMPLANT
EXTRACTOR VACUUM M CUP 4' TUBE (SUCTIONS)
GAUZE SPONGE 4X4 12PLY STRL LF (GAUZE/BANDAGES/DRESSINGS) ×6 IMPLANT
GLOVE BIOGEL PI IND STRL 6.5 (GLOVE) ×1 IMPLANT
GLOVE BIOGEL PI IND STRL 7.0 (GLOVE) ×1 IMPLANT
GLOVE BIOGEL PI INDICATOR 6.5 (GLOVE) ×2
GLOVE BIOGEL PI INDICATOR 7.0 (GLOVE) ×2
GLOVE ECLIPSE 6.5 STRL STRAW (GLOVE) ×3 IMPLANT
GOWN STRL REUS W/TWL LRG LVL3 (GOWN DISPOSABLE) ×6 IMPLANT
KIT ABG SYR 3ML LUER SLIP (SYRINGE) IMPLANT
NEEDLE HYPO 25X5/8 SAFETYGLIDE (NEEDLE) IMPLANT
NS IRRIG 1000ML POUR BTL (IV SOLUTION) ×3 IMPLANT
PACK C SECTION WH (CUSTOM PROCEDURE TRAY) ×3 IMPLANT
PAD ABD 7.5X8 STRL (GAUZE/BANDAGES/DRESSINGS) ×3 IMPLANT
PAD OB MATERNITY 4.3X12.25 (PERSONAL CARE ITEMS) ×3 IMPLANT
PENCIL SMOKE EVAC W/HOLSTER (ELECTROSURGICAL) ×3 IMPLANT
RTRCTR C-SECT PINK 25CM LRG (MISCELLANEOUS) ×3 IMPLANT
SUT MON AB 2-0 CT1 27 (SUTURE) ×3 IMPLANT
SUT MON AB 4-0 PS1 27 (SUTURE) IMPLANT
SUT PDS AB 0 CTX 60 (SUTURE) IMPLANT
SUT PLAIN 2 0 XLH (SUTURE) IMPLANT
SUT VIC AB 0 CTX 36 (SUTURE) ×8
SUT VIC AB 0 CTX36XBRD ANBCTRL (SUTURE) ×4 IMPLANT
SUT VIC AB 4-0 KS 27 (SUTURE) IMPLANT
TOWEL OR 17X24 6PK STRL BLUE (TOWEL DISPOSABLE) ×3 IMPLANT
TRAY FOLEY W/BAG SLVR 14FR LF (SET/KITS/TRAYS/PACK) ×3 IMPLANT

## 2017-08-17 NOTE — Anesthesia Postprocedure Evaluation (Signed)
Anesthesia Post Note  Patient: Kristen [name]  Procedure(s) Performed: REPEAT CESAREAN SECTION (N/A )     Patient location during evaluation: PACU Anesthesia Type: Spinal Level of consciousness: awake and alert Pain management: pain level controlled Vital Signs Assessment: post-procedure vital signs reviewed and stable Respiratory status: spontaneous breathing and respiratory function stable Cardiovascular status: blood pressure returned to baseline and stable Postop Assessment: spinal receding and no apparent nausea or vomiting Anesthetic complications: no    Last Vitals:  Vitals:   08/17/17 1415 08/17/17 1435  BP: 107/70 107/70  Pulse: (!) 55 (!) 56  Resp: 13 18  Temp:  36.5 C  SpO2: 99% 98%    Last Pain:  Vitals:   08/17/17 1445  TempSrc:   PainSc: 2    Pain Goal:                 Beryle Lathehomas E Kaymon Denomme

## 2017-08-17 NOTE — Op Note (Signed)
NAME: Kristen [name], Kristen [name] MEDICAL RECORD [id number]:[id number] ACCOUNT [id number] DATE OF BIRTH:1980/04/08 FACILITY: [facility] LOCATION: [id number] PHYSICIAN:Whitnie Deleon M. Claiborne Billings, DO  OPERATIVE REPORT  DATE OF PROCEDURE:  08/17/2017  PREOPERATIVE DIAGNOSIS:  Desired elective repeat cesarean section.  POSTOPERATIVE DIAGNOSIS:  Desired elective repeat cesarean section.  PROCEDURE:  Low transverse cesarean section.  SURGEON:  Philip Aspen, DO  ASSISTANT:  Ilda Mori, M.D.  FINDINGS:  Female infant in cephalic presentation with Apgars 8 and 9.  Appendix normal bilateral tubes and ovaries normal.  Minimal to no scar tissue from prior surgery.  ESTIMATED BLOOD LOSS:  709 mL.  IV FLUIDS:  2300 lactated Ringer's.  URINE OUTPUT:  100 mL.  COMPLICATIONS:  None.  CONDITION:  Stable to PACU.  DESCRIPTION OF PROCEDURE:  The patient was taken to the operating room where spinal anesthesia was administered and found to be adequate.  She was prepped and draped in a normal sterile fashion in dorsal supine position with a leftward tilt.  A scalpel  was used to make a Pfannenstiel skin incision which was carried down to underlying layer of fascia with Bovie cautery.  The fascia was incised with the scalpel in the midline and extended laterally with Mayo scissors.  Kocher clamps were placed at the  superior aspect of the fascial incision.  Rectus muscles were dissected off bluntly and sharply.  Kocher clamps were then placed at the inferior aspect of the fascial incision and rectus muscles were dissected off bluntly and sharply.  Hemostat was used  to separate the rectus muscles superiorly at the midline with good visualization.  The peritoneum was entered bluntly.  The incision was extended by lateral retraction.  The abdomen and pelvis were manually surveyed and no abnormalities noted.  An Alexis  self retractor was placed.  The vesicouterine peritoneum was identified, tented, and entered sharply with Metzenbaum  scissors.  The bladder flap was extended laterally and developed digitally.  A low transverse cesarean incision was made with the  scalpel.  Allis clamps were placed at the superior and inferior aspect to tent the amniotic sac away from the infant's head.  The amniotic sac ballooned from the incision and was entered sharply and with clear fluid emanating.  The uterine incision was  extended by cephalic and caudal traction.  The infant's head was located, elevated and delivered with minimal difficulty followed by the remainder of the infant's body.  After 30 seconds of delayed cord clamping, the cord was cut after double clamping  and the infant was handed off to awaiting neonatology.  Baby was dried and bulb suctioned while waiting occurred.  Cord blood was collected.  External massage of the uterus was performed.  With gentle traction on the umbilical cord, the placenta was  removed.  The uterus was cleared of all clot and debris and the uterine incision was closed with 0 Vicryl in a running locked fashion followed by a second layer of vertical imbrication.  Excellent hemostasis was noted.  The bilateral tubes and ovaries  and appendix were visualized.  The Alexis self retractor was removed.  All surfaces were examined and found to be hemostatic.  The uterine incision was reexamined and found to be hemostatic.  The peritoneum was then reapproximated and closed with 2-0  Monocryl in a running fashion with 2 suture loops at the lower aspect of the rectus muscle to reapproximate clear.  The fascia was then reapproximated and closed with looped PDS in a running fashion.  Subcutaneous tissue was irrigated,  dried and minimal  use of Bovie cautery was needed.  Vicryl on a Mellody DanceKeith needle was used to close the skin subcuticularly.  The patient tolerated the procedure well.  Sponge, lap and needle counts were correct x2.    The patient was taken to recovery in stable condition.  AN/NUANCE  D:08/17/2017 T:08/17/2017  JOB:[id number]

## 2017-08-17 NOTE — Brief Op Note (Signed)
08/17/2017  12:40 PM  PATIENT:  Haskel KhanAmber M Noseworthy  [age] y.o. female  PRE-OPERATIVE DIAGNOSIS:  Elective Repeat Cesarean Section  POST-OPERATIVE DIAGNOSIS:  Elective Repeat Cesarean Section  PROCEDURE:  Procedure(s) with comments: REPEAT CESAREAN SECTION (N/A) - Tracey RNFA  SURGEON:  Surgeon(s) and Role:    Philip Aspen* Paolo Okane, DO - Primary Ilda MoriKaplan, Richard, MD- Assist  ANESTHESIA:   spinal  EBL:  709 mL   FINDINGS: normal appendix, bilateral adnexa and uterus, female infant, cephalic,  APGARS 8/9, minimal scar tissue, clean planes  SPECIMEN:  Source of Specimen:  cord blood  COUNTS:  YES  PLAN OF CARE: Admit to inpatient   PATIENT DISPOSITION:  PACU - hemodynamically stable.   Delay start of Pharmacological VTE agent (>24hrs) due to surgical blood loss or risk of bleeding: not applicable

## 2017-08-17 NOTE — Anesthesia Preprocedure Evaluation (Signed)
Anesthesia Evaluation  Patient identified by MRN, date of birth, ID band Patient awake    Reviewed: Allergy & Precautions, H&P , NPO status , Patient's Chart, lab work & pertinent test results  Airway Mallampati: II  TM Distance: >3 FB Neck ROM: full    Dental  (+) Teeth Intact   Pulmonary neg pulmonary ROS,    breath sounds clear to auscultation       Cardiovascular negative cardio ROS   Rhythm:Regular Rate:Normal     Neuro/Psych negative neurological ROS  negative psych ROS   GI/Hepatic negative GI ROS, Neg liver ROS,   Endo/Other  Hypothyroidism   Renal/GU negative Renal ROS  negative genitourinary   Musculoskeletal negative musculoskeletal ROS (+)   Abdominal   Peds  Hematology  (+) anemia ,   Anesthesia Other Findings       Reproductive/Obstetrics (+) Pregnancy                             Anesthesia Physical  Anesthesia Plan  ASA: II  Anesthesia Plan: Spinal   Post-op Pain Management:    Induction:   PONV Risk Score and Plan: Treatment may vary due to age or medical condition  Airway Management Planned: Natural Airway and Simple Face Mask  Additional Equipment: None  Intra-op Plan:   Post-operative Plan:   Informed Consent: I have reviewed the patients History and Physical, chart, labs and discussed the procedure including the risks, benefits and alternatives for the proposed anesthesia with the patient or authorized representative who has indicated his/her understanding and acceptance.     Plan Discussed with: CRNA and Anesthesiologist  Anesthesia Plan Comments: (Labs reviewed. Platelets acceptable, patient not taking any blood thinning medications. Risks and benefits discussed with patient, patient expressed understanding and wished to proceed.)        Anesthesia Quick Evaluation

## 2017-08-17 NOTE — H&P (Addendum)
37 y.o. [redacted]w[redacted]d  G3P1011 comes in for schedule repeat c/s.  Otherwise has good fetal movement and no bleeding.  Past Medical History:  Diagnosis Date  . Hypothyroidism   . Infertility, female   . Medical history non-contributory     Past Surgical History:  Procedure Laterality Date  . ABLATION ON ENDOMETRIOSIS N/A 10/31/2012   Procedure: ABLATION ON ENDOMETRIOSIS;  Surgeon: Genia Del, MD;  Location: [facility];  Service: Gynecology;  Laterality: N/A;  . CESAREAN SECTION N/A 02/24/2015   Procedure: CESAREAN SECTION;  Surgeon: Waynard Reeds, MD;  Location: [facility];  Service: Obstetrics;  Laterality: N/A;  . CHROMOPERTUBATION  10/31/2012   Procedure: CHROMOPERTUBATION;  Surgeon: Genia Del, MD;  Location: [facility];  Service: Gynecology;;  . LAPAROSCOPY Right 10/31/2012   Procedure: LAPAROSCOPY OPERATIVE  SALPINGECTOMY;  Surgeon: Genia Del, MD;  Location: [facility];  Service: Gynecology;  Laterality: Right;  . SALPINGOOPHORECTOMY  10/31/2012   Procedure: SALPINGO OOPHORECTOMY;  Surgeon: Genia Del, MD;  Location: [facility];  Service: Gynecology;;  . WISDOM TOOTH EXTRACTION      OB History  Gravida Para Term Preterm AB Living  3 1 1   1 1   SAB TAB Ectopic Multiple Live Births  0   1 0 1    # Outcome Date GA Lbr Len/2nd Weight Sex Delivery Anes PTL Lv  3 Current           2 Term 02/24/15 [redacted]w[redacted]d 11:04 / 03:31 3.915 kg (8 lb 10.1 oz) M CS-LTranv EPI  LIV  1 Ectopic             Social History   Socioeconomic History  . Marital status: Married    Spouse name: Not on file  . Number of children: Not on file  . Years of education: Not on file  . Highest education level: Not on file  Occupational History  . Not on file  Social Needs  . Financial resource strain: Not on file  . Food insecurity:    Worry: Not on file    Inability: Not on file  . Transportation needs:    Medical: Not on file    Non-medical: Not on file  Tobacco Use  . Smoking status: Never Smoker  .  Smokeless tobacco: Never Used  Substance and Sexual Activity  . Alcohol use: No  . Drug use: No  . Sexual activity: Yes    Birth control/protection: None  Lifestyle  . Physical activity:    Days per week: Not on file    Minutes per session: Not on file  . Stress: Not on file  Relationships  . Social connections:    Talks on phone: Not on file    Gets together: Not on file    Attends religious service: Not on file    Active member of club or organization: Not on file    Attends meetings of clubs or organizations: Not on file    Relationship status: Not on file  . Intimate partner violence:    Fear of current or ex partner: Not on file    Emotionally abused: Not on file    Physically abused: Not on file    Forced sexual activity: Not on file  Other Topics Concern  . Not on file  Social History Narrative  . Not on file   Patient has no known allergies.    Prenatal Transfer Tool  Maternal Diabetes: No Genetic Screening: Declined Maternal Ultrasounds/Referrals: Normal Fetal Ultrasounds or other Referrals:  None Maternal Substance Abuse:  No Significant Maternal Medications: none Significant Maternal Lab Results: Lab values include: Group B Strep negative  Other PNC: hypothyroidsim, well controlled   Vitals:   08/17/17 1009  BP: 112/73  Pulse: 79  Resp: 20  Temp: 97.9 F (36.6 C)  TempSrc: Oral  Weight: 69.3 kg (152 lb 12.8 oz)  Height: 5\' 7"  (1.702 m)    Lungs/Cor:  NAD Abdomen:  soft, gravid Ex:  no cords, erythema    A/P   Admit for schedule repeat c/s  GBS neg  Ancef pre=op  Other routine care [facility]

## 2017-08-17 NOTE — Lactation Note (Signed)
This note was copied from a baby's chart. Lactation Consultation Note  Patient Name: Kristen Girard Cootermber Yager [id number]'s Date: 08/17/2017 Reason for consult: Initial assessment;Term  P2 mother whose infant is now 509 hours old.  Mother breastfed her 35 year old for 15 months.  Offered to assist with latch since baby is awake and alert.  Mother willingly accepted.  Mother's breasts are soft and non tender and nipples are everted.  Assisted to latch in the football hold on the left breast without difficulty.  Baby's lips were flanged and suck was strong and rhythmic.  A few swallows were noted.  Mother stated there was no pain with latch or feeding.  Demonstrated breast compressions during feedings and helped mother with positioning.  Encouraged to feed 8-12 times/24 hours or more if baby shows cues.  Reviewed feeding cues.  Continue STS, breast massage and hand expression after feedings.  Mother familiar with hand expression and will give EBM back to baby.  Mom made aware of O/P services, breastfeeding support groups, community resources, and our phone # for post-discharge questions. Father present and supportive.  Mother will call for assistance as needed.   Maternal Data Formula Feeding for Exclusion: No Has patient been taught Hand Expression?: Yes Does the patient have breastfeeding experience prior to this delivery?: Yes  Feeding Feeding Type: Breast Fed Length of feed: 10 min(still feeding when I left the room)  LATCH Score Latch: Grasps breast easily, tongue down, lips flanged, rhythmical sucking.  Audible Swallowing: A few with stimulation  Type of Nipple: Everted at rest and after stimulation  Comfort (Breast/Nipple): Soft / non-tender  Hold (Positioning): Assistance needed to correctly position infant at breast and maintain latch.  LATCH Score: 8  Interventions Interventions: Breast feeding basics reviewed;Assisted with latch;Skin to skin;Breast massage;Hand express;Position  options;Support pillows;Adjust position;Breast compression  Lactation Tools Discussed/Used WIC Program: No   Consult Status Consult Status: Follow-up Date: 08/18/17 Follow-up type: In-patient    Kristen [name] 08/17/2017, 9:30 PM

## 2017-08-17 NOTE — Anesthesia Procedure Notes (Signed)
Spinal  Patient location during procedure: OR Start time: 08/17/2017 11:33 AM End time: 08/17/2017 11:36 AM Staffing Anesthesiologist: Beryle LatheBrock, Thomas E, MD Performed: anesthesiologist  Preanesthetic Checklist Completed: patient identified, surgical consent, pre-op evaluation, timeout performed, IV checked, risks and benefits discussed and monitors and equipment checked Spinal Block Patient position: sitting Prep: DuraPrep Patient monitoring: heart rate, cardiac monitor, continuous pulse ox and blood pressure Approach: midline Location: L3-4 Injection technique: single-shot Needle Needle type: Pencan  Needle gauge: 24 G Additional Notes Functioning IV was confirmed and monitors were applied. Sterile prep and drape, including hand hygiene, mask, and sterile gloves were used. The patient was positioned and the spine was prepped. The skin was anesthetized with lidocaine. Free flow of clear CSF was obtained prior to injecting local anesthetic into the CSF. The spinal needle aspirated freely following injection. The needle was carefully withdrawn. The patient tolerated the procedure well. Consent was obtained prior to the procedure with all questions answered and concerns addressed. Risks including, but not limited to, bleeding, infection, nerve damage, paralysis, failed block, inadequate analgesia, allergic reaction, high spinal, itching, and headache were discussed and the patient wished to proceed.  Leslye Peerhomas Brock, MD

## 2017-08-17 NOTE — Transfer of Care (Signed)
Immediate Anesthesia Transfer of Care Note  Patient: Kristen [name]  Procedure(s) Performed: REPEAT CESAREAN SECTION (N/A )  Patient Location: PACU  Anesthesia Type:Spinal  Level of Consciousness: awake, alert , oriented and patient cooperative  Airway & Oxygen Therapy: Patient Spontanous Breathing  Post-op Assessment: Report given to RN and Post -op Vital signs reviewed and stable  Post vital signs: Reviewed and stable  Last Vitals:  Vitals Value Taken Time  BP    Temp    Pulse 59 08/17/2017 12:50 PM  Resp 9 08/17/2017 12:50 PM  SpO2 98 % 08/17/2017 12:50 PM  Vitals shown include unvalidated device data.  Last Pain:  Vitals:   08/17/17 1009  TempSrc: Oral  PainSc: 0-No pain         Complications: No apparent anesthesia complications

## 2017-08-18 LAB — CBC
HCT: 23.7 % — ABNORMAL LOW (ref 36.0–46.0)
Hemoglobin: 8.3 g/dL — ABNORMAL LOW (ref 12.0–15.0)
MCH: 30.3 pg (ref 26.0–34.0)
MCHC: 34.2 g/dL (ref 30.0–36.0)
MCV: 88.8 fL (ref 78.0–100.0)
Platelets: 161 10*3/uL (ref 150–400)
RBC: 2.67 MIL/uL — ABNORMAL LOW (ref 3.87–5.11)
RDW: 12.7 % (ref 11.5–15.5)
WBC: 11.7 10*3/uL — ABNORMAL HIGH (ref 4.0–10.5)

## 2017-08-18 LAB — BIRTH TISSUE RECOVERY COLLECTION (PLACENTA DONATION)

## 2017-08-18 NOTE — Progress Notes (Signed)
Patient is doing well.  She is ambulating, tolerating PO.  Pain control is good.  Lochia is appropriate.  Foley catheter was removed this AM and has not yet voided  Vitals:   08/18/17 0000 08/18/17 0230 08/18/17 0400 08/18/17 0600  BP:  (!) 93/47  (!) 103/59  Pulse:  65  65  Resp:  17  19  Temp:  98.8 F (37.1 C)  98.4 F (36.9 C)  TempSrc:  Oral  Oral  SpO2: 98% 97% 99% 98%  Weight:      Height:        NAD Fundus firm.  Large pressure bandage in place Ext: trace edema b/l  Lab Results  Component Value Date   WBC 11.7 (H) 08/18/2017   HGB 8.3 (L) 08/18/2017   HCT 23.7 (L) 08/18/2017   MCV 88.8 08/18/2017   PLT 161 08/18/2017    --/--/B POS (07/01 1023)/RI  A/P 36 y.o. [id number] POD#1 s/p RCS. Routine care.   Expect d/c tomorrow.   Remove pressure bandage after shower today.  Awaiting void  [facility]ANNA GEFFEL Caeley Dohrmann

## 2017-08-18 NOTE — Plan of Care (Signed)
Pain increasing with increased activity. Talked with her about options for pain medicine, keeping bladder empty, and slowly increasing activity. Encouraged her to walk in hall 3 times/day. Patient decided to take stronger pain medicine so she feels good enough to walk.

## 2017-08-19 MED ORDER — OXYCODONE-ACETAMINOPHEN 5-325 MG PO TABS: 2.0000 | ORAL_TABLET | ORAL | 0 refills | Status: DC | PRN

## 2017-08-19 NOTE — Lactation Note (Signed)
This note was copied from a baby's chart. Lactation Consultation Note  Patient Name: Kristen [name] Today's Date: 08/19/2017    Visited with P2 Mom on day of discharge, baby 6947 hrs old, and at 6% weight loss.  Baby born by repeat C/Section. Mom denies any difficulty with latching baby or any pain or trauma on nipples.  Encouraged continued STS, and feeding baby often on cue.  Goal of 8-12 feedings per 24 hrs. Engorgement prevention and treatment discussed.  Mom has a DEBP at home. Mom aware of OP lactation services available to her.  Encouraged her to call prn for guidance.   Kristen [name], Kristen [name] E 08/19/2017, 11:17 AM

## 2017-08-19 NOTE — Discharge Summary (Signed)
Obstetric Discharge Summary Reason for Admission: cesarean section Prenatal Procedures: ultrasound Intrapartum Procedures: cesarean: low cervical, transverse Postpartum Procedures: none Complications-Operative and Postpartum: none Hemoglobin  Date Value Ref Range Status  08/18/2017 8.3 (L) 12.0 - 15.0 g/dL Final    Comment:    REPEATED TO VERIFY DELTA CHECK NOTED    HCT  Date Value Ref Range Status  08/18/2017 23.7 (L) 36.0 - 46.0 % Final    Physical Exam:  General: alert and cooperative Lochia: appropriate Uterine Fundus: firm Incision: healing well, no significant drainage DVT Evaluation: No evidence of DVT seen on physical exam.  Discharge Diagnoses: Term Pregnancy-delivered  Discharge Information: Date: 08/19/2017 Activity: pelvic rest Diet: routine Medications: PNV, Ibuprofen and Percocet, iron Condition: stable Instructions: refer to practice specific booklet Discharge to: home Follow-up Information    Kristen [name], Kristen Agramonte, Kristen [name] Follow up in 2 week(s).   Specialty:  Obstetrics and Gynecology Contact information: [address] [place] [address] [phone]           Newborn Data: Live born female  Birth Weight: 7 lb 6.2 oz (3350 g) APGAR: 8, 9  Newborn Delivery   Birth date/time:  08/17/2017 11:59:00 Delivery type:  C-Section, Low Transverse Trial of labor:  No C-section categorization:  Repeat     Home with mother.  Kristen [name], Kristen [name] 08/19/2017, 8:59 AM

## 2017-08-20 LAB — BPAM RBC
BLOOD PRODUCT EXPIRATION DATE: 201907262359
Blood Product Expiration Date: 201907262359
ISSUE DATE / TIME: 201906270529
Unit Type and Rh: 5100
Unit Type and Rh: 5100

## 2017-08-20 LAB — TYPE AND SCREEN
ABO/RH(D): B POS
ANTIBODY SCREEN: POSITIVE
Donor AG Type: NEGATIVE
Donor AG Type: NEGATIVE
Unit division: 0
Unit division: 0

## 2019-12-18 ENCOUNTER — Ambulatory Visit: Payer: 59 | Admitting: Family Medicine

## 2019-12-18 ENCOUNTER — Other Ambulatory Visit: Payer: Self-pay

## 2019-12-18 ENCOUNTER — Encounter: Payer: Self-pay | Admitting: Family Medicine

## 2019-12-18 VITALS — BP 108/68 | HR 69 | Ht 67.0 in | Wt 138.0 lb

## 2019-12-18 DIAGNOSIS — G2589 Other specified extrapyramidal and movement disorders: Secondary | ICD-10-CM | POA: Diagnosis not present

## 2019-12-18 DIAGNOSIS — M999 Biomechanical lesion, unspecified: Secondary | ICD-10-CM | POA: Diagnosis not present

## 2019-12-18 MED ORDER — MELOXICAM 15 MG PO TABS: 15.0000 mg | ORAL_TABLET | Freq: Every day | ORAL | 0 refills | Status: DC

## 2019-12-18 NOTE — Assessment & Plan Note (Signed)
   Decision today to treat with OMT was based on Physical Exam  After verbal consent patient was treated with HVLA, ME, FPR techniques in cervical, thoracic, rib,  areas,  Patient tolerated the procedure well with improvement in symptoms  Patient given exercises, stretches and lifestyle modifications  See medications in patient instructions if given  Patient will follow up in 4-8 weeks 

## 2019-12-18 NOTE — Patient Instructions (Signed)
Exercises 3x a week Think about shoulders down and back Meloxicam daily for 10 days then as needed Do not use NSAIDS such as Advil or Aleve when taking Meloxicam It is ok to use Tylenol for additional pain relief  See me in 4-6 weeks

## 2019-12-18 NOTE — Progress Notes (Signed)
[name] [address] [place] [address] Phone: [phone] Subjective:   Kristen [name], am serving as a scribe for Dr. Antoine Primas. This visit occurred during the SARS-CoV-2 public health emergency.  Safety protocols were in place, including screening questions prior to the visit, additional usage of staff PPE, and extensive cleaning of exam room while observing appropriate contact time as indicated for disinfecting solutions.   I'm seeing this patient by the request  of:  Patient, No Pcp Per  CC: Neck and upper back pain  [id number]  Kristen [name] is a 39 y.o. female coming in with complaint of upper back pain. Patient states that her pain is between scapula and can radiate into her neck. Tries to stretch to alleviate pain as well as taking IBU. Patient has young children and states that she is always in forward flexed position of cervical and thoracic spine.  No radiation to any of the extremities.  Rates the severity of pain is 4 out of 10.  Taking meloxicam on a regular basis of at least daily at this time      Past Medical History:  Diagnosis Date  . Hypothyroidism   . Infertility, female   . Medical history non-contributory    Past Surgical History:  Procedure Laterality Date  . ABLATION ON ENDOMETRIOSIS N/A 10/31/2012   Procedure: ABLATION ON ENDOMETRIOSIS;  Surgeon: Genia Del, MD;  Location: [facility];  Service: Gynecology;  Laterality: N/A;  . CESAREAN SECTION N/A 02/24/2015   Procedure: CESAREAN SECTION;  Surgeon: Waynard Reeds, MD;  Location: [facility];  Service: Obstetrics;  Laterality: N/A;  . CESAREAN SECTION N/A 08/17/2017   Procedure: REPEAT CESAREAN SECTION;  Surgeon: Philip Aspen, DO;  Location: [facility];  Service: Obstetrics;  Laterality: N/A;  Tracey RNFA  . CHROMOPERTUBATION  10/31/2012   Procedure: CHROMOPERTUBATION;  Surgeon: Genia Del, MD;  Location: [facility];  Service: Gynecology;;  .  LAPAROSCOPY Right 10/31/2012   Procedure: LAPAROSCOPY OPERATIVE  SALPINGECTOMY;  Surgeon: Genia Del, MD;  Location: [facility];  Service: Gynecology;  Laterality: Right;  . SALPINGOOPHORECTOMY  10/31/2012   Procedure: SALPINGO OOPHORECTOMY;  Surgeon: Genia Del, MD;  Location: [facility];  Service: Gynecology;;  . WISDOM TOOTH EXTRACTION     Social History   Socioeconomic History  . Marital status: Married    Spouse name: Not on file  . Number of children: Not on file  . Years of education: Not on file  . Highest education level: Not on file  Occupational History  . Not on file  Tobacco Use  . Smoking status: Never Smoker  . Smokeless tobacco: Never Used  Substance and Sexual Activity  . Alcohol use: No  . Drug use: No  . Sexual activity: Yes    Birth control/protection: None  Other Topics Concern  . Not on file  Social History Narrative  . Not on file   Social Determinants of Health   Financial Resource Strain:   . Difficulty of Paying Living Expenses: Not on file  Food Insecurity:   . Worried About Programme researcher, broadcasting/film/video in the Last Year: Not on file  . Ran Out of Food in the Last Year: Not on file  Transportation Needs:   . Lack of Transportation (Medical): Not on file  . Lack of Transportation (Non-Medical): Not on file  Physical Activity:   . Days of Exercise per Week: Not on file  . Minutes of Exercise per Session: Not  on file  Stress:   . Feeling of Stress : Not on file  Social Connections:   . Frequency of Communication with Friends and Family: Not on file  . Frequency of Social Gatherings with Friends and Family: Not on file  . Attends Religious Services: Not on file  . Active Member of Clubs or Organizations: Not on file  . Attends Banker Meetings: Not on file  . Marital Status: Not on file   No Known Allergies Family History  Problem Relation Age of Onset  . Cancer Paternal Grandmother     Current Outpatient Medications (Endocrine &  Metabolic):  .  levothyroxine (SYNTHROID, LEVOTHROID) 88 MCG tablet, Take 88 mcg by mouth daily before breakfast.    Current Outpatient Medications (Analgesics):  .  oxyCODONE-acetaminophen (PERCOCET/ROXICET) 5-325 MG tablet, Take 2 tablets by mouth every 4 (four) hours as needed (pain scale > 7). .  meloxicam (MOBIC) 15 MG tablet, Take 1 tablet (15 mg total) by mouth daily.  Current Outpatient Medications (Hematological):  .  ferrous gluconate (FERGON) 240 (27 FE) MG tablet, Take 240 mg by mouth daily.  Current Outpatient Medications (Other):  Marland Kitchen  Prenatal Vit-Fe Fumarate-FA (MULTIVITAMIN-PRENATAL) 27-0.8 MG TABS tablet, Take 1 tablet by mouth daily at 12 noon.   Reviewed prior external information including notes and imaging from  primary care provider As well as notes that were available from care everywhere and other healthcare systems.  Past medical history, social, surgical and family history all reviewed in electronic medical record.  No pertanent information unless stated regarding to the chief complaint.   Review of Systems:  No headache, visual changes, nausea, vomiting, diarrhea, constipation, dizziness, abdominal pain, skin rash, fevers, chills, night sweats, weight loss, swollen lymph nodes, body aches, joint swelling, chest pain, shortness of breath, mood changes. POSITIVE muscle aches  Objective  Blood pressure 108/68, pulse 69, height 5\' 7"  (1.702 m), weight 138 lb (62.6 kg), SpO2 99 %, unknown if currently breastfeeding.   General: No apparent distress alert and oriented x3 mood and affect normal, dressed appropriately.  HEENT: Pupils equal, extraocular movements intact  Respiratory: Patient's speak in full sentences and does not appear short of breath  Cardiovascular: No lower extremity edema, non tender, no erythema  Neuro: Cranial nerves II through XII are intact, neurovascularly intact in all extremities with 2+ DTRs and 2+ pulses.  Gait normal with good balance  and coordination.  MSK:  Non tender with full range of motion and good stability and symmetric strength and tone of , elbows, wrist, hip, knee and ankles bilaterally.  Patient back exam shows the patient does have very mild increase in kyphosis of the upper thoracic spine.  No significant scoliosis.  Tightness noted in the paraspinal musculature and parascapular region on the left side.  Negative straight leg test.  Patient has 5/5 strength of the upper extremities as well.  Neck exam does show some very mild limited flexion of the neck.  Negative Spurling's.  Osteopathic findings C4 flexed rotated and side bent left T4 extended rotated and side bent left inhaled third rib T8 extended rotated and side bent left     Impression and Recommendations:     The above documentation has been reviewed and is accurate and complete , DO

## 2019-12-18 NOTE — Assessment & Plan Note (Signed)
Patient is having some scapular dyskinesis.  Meloxicam 15 mg every day for some breakthrough pain.  Warned of potential side effects and not to take any other anti-inflammatories.  Increase activity as tolerated.  Patient will do more icing regimen.  Work with Event organiser today, follow-up again in 4 to 8 weeks

## 2020-01-09 ENCOUNTER — Other Ambulatory Visit: Payer: Self-pay | Admitting: Family Medicine

## 2020-01-18 ENCOUNTER — Encounter: Payer: Self-pay | Admitting: Family Medicine

## 2020-01-18 ENCOUNTER — Other Ambulatory Visit: Payer: Self-pay

## 2020-01-18 ENCOUNTER — Ambulatory Visit: Payer: 59 | Admitting: Family Medicine

## 2020-01-18 VITALS — BP 102/80 | HR 67 | Ht 67.0 in | Wt 138.0 lb

## 2020-01-18 DIAGNOSIS — G2589 Other specified extrapyramidal and movement disorders: Secondary | ICD-10-CM

## 2020-01-18 DIAGNOSIS — M999 Biomechanical lesion, unspecified: Secondary | ICD-10-CM | POA: Diagnosis not present

## 2020-01-18 NOTE — Assessment & Plan Note (Signed)
Patient is 70% better at this time.  Does have the meloxicam for breakthrough.  Patient does or can continue to take it as needed.  Increase activity, icing regimen.  Follow-up again in  8 weeks

## 2020-01-18 NOTE — Progress Notes (Signed)
[name] [address] [place] [address] Phone: [phone] Subjective:   Kristen [name], am serving as a scribe for Dr. Antoine Primas. This visit occurred during the SARS-CoV-2 public health emergency.  Safety protocols were in place, including screening questions prior to the visit, additional usage of staff PPE, and extensive cleaning of exam room while observing appropriate contact time as indicated for disinfecting solutions.   I'm seeing this patient by the request  of:  Patient, No Pcp Per  CC: Back and neck pain follow-up  [id number]  Kristen [name] is a 39 y.o. female coming in with complaint of back and neck pain. OMT 12/18/2019. Patient states that her pain is intermittent since last visit.  Would state that she is approximately 70% better.  Been doing exercises fairly regularly.  Not having any significant discomfort and pain that is stopping her from activities.  Medications patient has been prescribed: Meloxicam  Taking: Intermittently         Reviewed prior external information including notes and imaging from previsou exam, outside providers and external EMR if available.   As well as notes that were available from care everywhere and other healthcare systems.  Past medical history, social, surgical and family history all reviewed in electronic medical record.  No pertanent information unless stated regarding to the chief complaint.   Past Medical History:  Diagnosis Date  . Hypothyroidism   . Infertility, female   . Medical history non-contributory     No Known Allergies   Review of Systems:  No headache, visual changes, nausea, vomiting, diarrhea, constipation, dizziness, abdominal pain, skin rash, fevers, chills, night sweats, weight loss, swollen lymph nodes, body aches, joint swelling, chest pain, shortness of breath, mood changes. POSITIVE muscle aches  Objective  Blood pressure 102/80, pulse 67, height 5'  7" (1.702 m), weight 138 lb (62.6 kg), SpO2 99 %, unknown if currently breastfeeding.   General: No apparent distress alert and oriented x3 mood and affect normal, dressed appropriately.  HEENT: Pupils equal, extraocular movements intact  Respiratory: Patient's speak in full sentences and does not appear short of breath  Cardiovascular: No lower extremity edema, non tender, no erythema  Neuro: Cranial nerves II through XII are intact, neurovascularly intact in all extremities with 2+ DTRs and 2+ pulses.  Gait normal with good balance and coordination.  MSK:  Non tender with full range of motion and good stability and symmetric strength and tone of shoulders, elbows, wrist, hip, knee and ankles bilaterally.  Back -pain more in the parascapular region right greater than left.  Patient does have some mild tightness noted of the neck but improvement from previous exam.  Negative Spurling's.  5/5 strength of the extremities  Osteopathic findings   C5 flexed rotated and side bent left T5 extended rotated and side bent right inhaled rib        Assessment and Plan:  Scapular dyskinesis Patient is 70% better at this time.  Does have the meloxicam for breakthrough.  Patient does or can continue to take it as needed.  Increase activity, icing regimen.  Follow-up again in  8 weeks   Nonallopathic problems  Decision today to treat with OMT was based on Physical Exam  After verbal consent patient was treated with HVLA, ME, FPR techniques in cervical, rib, thoracic  areas  Patient tolerated the procedure well with improvement in symptoms  Patient given exercises, stretches and lifestyle modifications  See medications in patient instructions  if given  Patient will follow up in 8 weeks      The above documentation has been reviewed and is accurate and complete Judi Saa, DO       Note: This dictation was prepared with Dragon dictation along with smaller phrase technology. Any  transcriptional errors that result from this process are unintentional.

## 2020-03-19 NOTE — Progress Notes (Unsigned)
[name] [address] [place] [address] Phone: [phone] Subjective:   Kristen [name], am serving as a scribe for Dr. Antoine Primas. This visit occurred during the SARS-CoV-2 public health emergency.  Safety protocols were in place, including screening questions prior to the visit, additional usage of staff PPE, and extensive cleaning of exam room while observing appropriate contact time as indicated for disinfecting solutions.   I'm seeing this patient by the request  of:  Patient, No Pcp Per  CC: Back pain and neck pain follow-up  [id number]  Kristen [name] is a 40 y.o. female coming in with complaint of back and neck pain. OMT 01/18/2020. Patient states doing very well at this time.  No significant discomfort or pain.  Feeling like she is making significant progress.  Medications patient has been prescribed: Meloxicam  Taking: No         Reviewed prior external information including notes and imaging from previsou exam, outside providers and external EMR if available.   As well as notes that were available from care everywhere and other healthcare systems.  Past medical history, social, surgical and family history all reviewed in electronic medical record.  No pertanent information unless stated regarding to the chief complaint.   Past Medical History:  Diagnosis Date  . Hypothyroidism   . Infertility, female   . Medical history non-contributory     No Known Allergies   Review of Systems:  No headache, visual changes, nausea, vomiting, diarrhea, constipation, dizziness, abdominal pain, skin rash, fevers, chills, night sweats, weight loss, swollen lymph nodes, body aches, joint swelling, chest pain, shortness of breath, mood changes. POSITIVE muscle aches  Objective  Blood pressure 118/86, pulse 73, height 5\' 7"  (1.702 m), weight 138 lb (62.6 kg), SpO2 99 %, unknown if currently breastfeeding.   General: No apparent  distress alert and oriented x3 mood and affect normal, dressed appropriately.  HEENT: Pupils equal, extraocular movements intact  Respiratory: Patient's speak in full sentences and does not appear short of breath  Cardiovascular: No lower extremity edema, non tender, no erythema  Neuro: Cranial nerves II through XII are intact, neurovascularly intact in all extremities with 2+ DTRs and 2+ pulses.  Gait normal with good balance and coordination.  MSK:  Non tender with full range of motion and good stability and symmetric strength and tone of shoulders, elbows, wrist, hip, knee and ankles bilaterally.  Back -upper back still has some very mild tightness.  Very mild scapular dyskinesis still noted.  Patient on neck does have very minimal tightness but nothing severe.  5-5 strength of the upper extremities  Osteopathic findings  C2 flexed rotated and side bent right C6 flexed rotated and side bent left T3 extended rotated and side bent right inhaled rib T9 extended rotated and side bent left       Assessment and Plan:  Scapular dyskinesis Patient is doing remarkably well at this time.  Not having any significant discomfort or pain.  Not taking any significant medication at this time.  We will continue with the iron supplementation follow-up with me.  3 to 4 months.    Nonallopathic problems  Decision today to treat with OMT was based on Physical Exam  After verbal consent patient was treated with HVLA, ME, FPR techniques in cervical, rib, thoracic areas  Patient tolerated the procedure well with improvement in symptoms  Patient given exercises, stretches and lifestyle modifications  See medications in patient instructions if  given  Patient will follow up in 4-8 weeks      The above documentation has been reviewed and is accurate and complete Judi Saa, DO       Note: This dictation was prepared with Dragon dictation along with smaller phrase technology. Any  transcriptional errors that result from this process are unintentional.

## 2020-03-20 ENCOUNTER — Ambulatory Visit: Payer: 59 | Admitting: Family Medicine

## 2020-03-20 ENCOUNTER — Encounter: Payer: Self-pay | Admitting: Family Medicine

## 2020-03-20 ENCOUNTER — Other Ambulatory Visit: Payer: Self-pay

## 2020-03-20 VITALS — BP 118/86 | HR 73 | Ht 67.0 in | Wt 138.0 lb

## 2020-03-20 DIAGNOSIS — M999 Biomechanical lesion, unspecified: Secondary | ICD-10-CM

## 2020-03-20 DIAGNOSIS — G2589 Other specified extrapyramidal and movement disorders: Secondary | ICD-10-CM | POA: Diagnosis not present

## 2020-03-20 NOTE — Patient Instructions (Signed)
No big changes Thanks for bringing cheer squad See me in 3-4 months

## 2020-03-20 NOTE — Assessment & Plan Note (Signed)
Patient is doing remarkably well at this time.  Not having any significant discomfort or pain.  Not taking any significant medication at this time.  We will continue with the iron supplementation follow-up with me.  3 to 4 months.

## 2020-06-07 NOTE — Progress Notes (Signed)
[name] [address] [place] [address] Phone: [phone] Subjective:   Kristen [name], am serving as a scribe for Dr. Antoine [name]. This visit occurred during the SARS-CoV-2 public health emergency.  Safety protocols were in place, including screening questions prior to the visit, additional usage of staff PPE, and extensive cleaning of exam room while observing appropriate contact time as indicated for disinfecting solutions.   I'm seeing this patient by the request  of:  Patient, No Pcp Per (Inactive)  CC: Upper back and neck pain follow-up  [id number]  Kristen [name] is a 40 y.o. female coming in with complaint of back and neck pain. OMT 03/20/2020. Patient states that she is having some upper back tightness due to stress but otherwise has been good since last visit.   Medications patient has been prescribed: none          Reviewed prior external information including notes and imaging from previsou exam, outside providers and external EMR if available.   As well as notes that were available from care everywhere and other healthcare systems.  Past medical history, social, surgical and family history all reviewed in electronic medical record.  No pertanent information unless stated regarding to the chief complaint.   Past Medical History:  Diagnosis Date  . Hypothyroidism   . Infertility, female   . Medical history non-contributory     No Known Allergies   Review of Systems:  No headache, visual changes, nausea, vomiting, diarrhea, constipation, dizziness, abdominal pain, skin rash, fevers, chills, night sweats, weight loss, swollen lymph nodes, body aches, joint swelling, chest pain, shortness of breath, mood changes. POSITIVE muscle aches  Objective  Blood pressure 114/82, pulse 79, height 5\' 7"  (1.702 m), SpO2 97 %, unknown if currently breastfeeding.   General: No apparent distress alert and oriented x3 mood and affect  normal, dressed appropriately.  HEENT: Pupils equal, extraocular movements intact  Respiratory: Patient's speak in full sentences and does not appear short of breath  Cardiovascular: No lower extremity edema, non tender, no erythema  Neuro: Cranial nerves II through XII are intact, neurovascularly intact in all extremities with 2+ Gait normal with good balance and coordination.  MSK:  Non tender with full range of motion and good stability and symmetric strength and tone of shoulders, elbows, wrist, hip, knee and ankles bilaterally.  Back -mild tightness noted in the paraspinal musculature of the lumbar spine right greater than left.  With mild pain that seems to be also in the scapular area right greater than left.  Neck also has some very mild tightness on the right side of the neck.  Negative Spurling's.  5-5 strength in the upper limbs.  Osteopathic findings  C5 F RS right  T3 extended rotated and side bent right inhaled rib T9 extended rotated and side bent left       Assessment and Plan:  Scapular dyskinesis Patient is having more tightness than usual.  Chronic problem.  Patient though has been doing exercises only occasionally.  Nothing severe.  Patient is only needed anti-inflammatories 1 time since we have seen her.  No other large changes.  Follow-up with me again in 3 to 4 months    Nonallopathic problems  Decision today to treat with OMT was based on Physical Exam  After verbal consent patient was treated with HVLA, ME, FPR techniques in cervical, rib, thoracic,   areas  Patient tolerated the procedure well with improvement in symptoms  Patient given exercises, stretches and lifestyle modifications  See medications in patient instructions if given  Patient will follow up in 4-8 weeks      The above documentation has been reviewed and is accurate and complete Judi Saa, DO       Note: This dictation was prepared with Dragon dictation along with smaller  phrase technology. Any transcriptional errors that result from this process are unintentional.

## 2020-06-10 ENCOUNTER — Encounter: Payer: Self-pay | Admitting: Family Medicine

## 2020-06-10 ENCOUNTER — Ambulatory Visit: Payer: 59 | Admitting: Family Medicine

## 2020-06-10 ENCOUNTER — Other Ambulatory Visit: Payer: Self-pay

## 2020-06-10 VITALS — BP 114/82 | HR 79 | Ht 67.0 in

## 2020-06-10 DIAGNOSIS — G2589 Other specified extrapyramidal and movement disorders: Secondary | ICD-10-CM

## 2020-06-10 DIAGNOSIS — M9901 Segmental and somatic dysfunction of cervical region: Secondary | ICD-10-CM

## 2020-06-10 DIAGNOSIS — M999 Biomechanical lesion, unspecified: Secondary | ICD-10-CM

## 2020-06-10 DIAGNOSIS — M9908 Segmental and somatic dysfunction of rib cage: Secondary | ICD-10-CM | POA: Diagnosis not present

## 2020-06-10 DIAGNOSIS — M9902 Segmental and somatic dysfunction of thoracic region: Secondary | ICD-10-CM

## 2020-06-10 NOTE — Patient Instructions (Signed)
Good to see you Tell Susy Frizzle to plan vacation See me in 3-4 months

## 2020-06-10 NOTE — Assessment & Plan Note (Signed)
Patient is having more tightness than usual.  Chronic problem.  Patient though has been doing exercises only occasionally.  Nothing severe.  Patient is only needed anti-inflammatories 1 time since we have seen her.  No other large changes.  Follow-up with me again in 3 to 4 months

## 2020-09-02 NOTE — Progress Notes (Signed)
[name] [address] [place] [address] Phone: [phone] Subjective:   [name], am serving as a scribe for Dr. Antoine Primas. This visit occurred during the SARS-CoV-2 public health emergency.  Safety protocols were in place, including screening questions prior to the visit, additional usage of staff PPE, and extensive cleaning of exam room while observing appropriate contact time as indicated for disinfecting solutions.   I'm seeing this patient by the request  of:  Patient, No Pcp Per (Inactive)  CC: neck and back pain follow up   [id number]  Kristen [name] is a 40 y.o. female coming in with complaint of back and neck pain. OMT 06/10/2020.  Patient found to have more of a scapular dyskinesis and has been doing relatively well with conservative therapy.  Patient states overall doing much better.  Patient states some very mild discomfort and pain but nothing severe.  Medications patient has been prescribed: None           Reviewed prior external information including notes and imaging from previsou exam, outside providers and external EMR if available.   As well as notes that were available from care everywhere and other healthcare systems.  Past medical history, social, surgical and family history all reviewed in electronic medical record.  No pertanent information unless stated regarding to the chief complaint.   Past Medical History:  Diagnosis Date   Hypothyroidism    Infertility, female    Medical history non-contributory     No Known Allergies   Review of Systems:  No headache, visual changes, nausea, vomiting, diarrhea, constipation, dizziness, abdominal pain, skin rash, fevers, chills, night sweats, weight loss, swollen lymph nodes, body aches, joint swelling, chest pain, shortness of breath, mood changes. POSITIVE muscle aches  Objective  Blood pressure 110/84, pulse 62, height 5\' 7"  (1.702 m), weight 138 lb  (62.6 kg), SpO2 99 %, unknown if currently breastfeeding.   General: No apparent distress alert and oriented x3 mood and affect normal, dressed appropriately.  HEENT: Pupils equal, extraocular movements intact  Respiratory: Patient's speak in full sentences and does not appear short of breath  Cardiovascular: No lower extremity edema, non tender, no erythema  Upper back exam shows tightness in the right parascapular region and still noted.  Patient does have very mild scapular dyskinesis still noted on the right side.  Patient's lower back good range of motion noted. Osteopathic findings   C6 flexed rotated and side bent left T5 extended rotated and side bent right inhaled rib        Assessment and Plan:  Scapular dyskinesis Patient is doing much better at this time.  Encourage patient to increase activity as tolerated.  Discussed icing regimen exercises.  We will follow-up with me again in 12 weeks    Nonallopathic problems  Decision today to treat with OMT was based on Physical Exam  After verbal consent patient was treated with HVLA, ME, FPR techniques in cervical, rib, thoracic areas  Patient tolerated the procedure well with improvement in symptoms  Patient given exercises, stretches and lifestyle modifications  See medications in patient instructions if given  Patient will follow up in 4-8 weeks     The above documentation has been reviewed and is accurate and complete , DO        Note: This dictation was prepared with Dragon dictation along with smaller phrase technology. Any transcriptional errors that result from this process are unintentional.

## 2020-09-03 ENCOUNTER — Other Ambulatory Visit: Payer: Self-pay

## 2020-09-03 ENCOUNTER — Encounter: Payer: Self-pay | Admitting: Family Medicine

## 2020-09-03 ENCOUNTER — Ambulatory Visit: Payer: 59 | Admitting: Family Medicine

## 2020-09-03 VITALS — BP 110/84 | HR 62 | Ht 67.0 in | Wt 138.0 lb

## 2020-09-03 DIAGNOSIS — M9908 Segmental and somatic dysfunction of rib cage: Secondary | ICD-10-CM

## 2020-09-03 DIAGNOSIS — M9901 Segmental and somatic dysfunction of cervical region: Secondary | ICD-10-CM | POA: Diagnosis not present

## 2020-09-03 DIAGNOSIS — M9902 Segmental and somatic dysfunction of thoracic region: Secondary | ICD-10-CM

## 2020-09-03 DIAGNOSIS — G2589 Other specified extrapyramidal and movement disorders: Secondary | ICD-10-CM | POA: Diagnosis not present

## 2020-09-03 NOTE — Assessment & Plan Note (Signed)
Patient is doing much better at this time.  Encourage patient to increase activity as tolerated.  Discussed icing regimen exercises.  We will follow-up with me again in 12 weeks

## 2020-09-03 NOTE — Patient Instructions (Signed)
Great to see you Have a great vacation See me in 3 months

## 2020-12-03 NOTE — Progress Notes (Signed)
  [name] [address] [place] [address] Phone: [phone] Subjective:   [name], am serving as a scribe for Dr. Antoine Primas.  This visit occurred during the SARS-CoV-2 public health emergency.  Safety protocols were in place, including screening questions prior to the visit, additional usage of staff PPE, and extensive cleaning of exam room while observing appropriate contact time as indicated for disinfecting solutions.   I'm seeing this patient by the request  of:  Patient, No Pcp Per (Inactive)  CC: back and neck pain f/u   [id number]  Kristen [name] is a 40 y.o. female coming in with complaint of back and neck pain. OMT 09/03/2020. Patient states that she had some pain last week in thoracic spine which has gone away today.  Patient states since then has been doing relatively well.  Has been doing some lifting occasionally which she does think has been helpful.  Nothing that is really stopped her from too much activities.  Medications patient has been prescribed: None  Taking:          Past Medical History:  Diagnosis Date   Hypothyroidism    Infertility, female    Medical history non-contributory     No Known Allergies   Review of Systems:  No headache, visual changes, nausea, vomiting, diarrhea, constipation, dizziness, abdominal pain, skin rash, fevers, chills, night sweats, weight loss, swollen lymph nodes, body aches, joint swelling, chest pain, shortness of breath, mood changes. POSITIVE muscle aches  Objective  Blood pressure 110/74, pulse 69, height 5\' 7"  (1.702 m), weight 137 lb (62.1 kg), SpO2 97 %, unknown if currently breastfeeding.   General: No apparent distress alert and oriented x3 mood and affect normal, dressed appropriately.  HEENT: Pupils equal, extraocular movements intact  Respiratory: Patient's speak in full sentences and does not appear short of breath  Cardiovascular: No lower extremity  edema, non tender, no erythema  Neck exam mild loss of lordosis.  Mild tightness noted the parascapular region.  Does have tightness still noted of the right scapular region Low back exam more pain in the thoracolumbar juncture or right greater than left.   Osteopathic findings  C5 flexed rotated and side bent right T3 extended rotated and side bent right inhaled rib T11 extended rotated and side bent right        Assessment and Plan:  Scapular dyskinesis Chronic problem is stable.  Making progress.  Does have the meloxicam for any breakthrough.  Did take ibuprofen recently.  No radiation to any of the extremities.  Patient will follow up again in 2 to 3 months.   Nonallopathic problems  Decision today to treat with OMT was based on Physical Exam  After verbal consent patient was treated with HVLA, ME, FPR techniques in cervical, rib, thoracic,  areas  Patient tolerated the procedure well with improvement in symptoms  Patient given exercises, stretches and lifestyle modifications  See medications in patient instructions if given  Patient will follow up in 4-8 weeks      The above documentation has been reviewed and is accurate and complete , DO        Note: This dictation was prepared with Dragon dictation along with smaller phrase technology. Any transcriptional errors that result from this process are unintentional.

## 2020-12-04 ENCOUNTER — Ambulatory Visit: Payer: 59 | Admitting: Family Medicine

## 2020-12-04 ENCOUNTER — Encounter: Payer: Self-pay | Admitting: Family Medicine

## 2020-12-04 ENCOUNTER — Other Ambulatory Visit: Payer: Self-pay

## 2020-12-04 VITALS — BP 110/74 | HR 69 | Ht 67.0 in | Wt 137.0 lb

## 2020-12-04 DIAGNOSIS — M9903 Segmental and somatic dysfunction of lumbar region: Secondary | ICD-10-CM

## 2020-12-04 DIAGNOSIS — G2589 Other specified extrapyramidal and movement disorders: Secondary | ICD-10-CM | POA: Diagnosis not present

## 2020-12-04 DIAGNOSIS — M9902 Segmental and somatic dysfunction of thoracic region: Secondary | ICD-10-CM

## 2020-12-04 DIAGNOSIS — M9904 Segmental and somatic dysfunction of sacral region: Secondary | ICD-10-CM

## 2020-12-04 NOTE — Patient Instructions (Signed)
Stay active See me in 2 months

## 2020-12-04 NOTE — Assessment & Plan Note (Signed)
Chronic problem is stable.  Making progress.  Does have the meloxicam for any breakthrough.  Did take ibuprofen recently.  No radiation to any of the extremities.  Patient will follow up again in 2 to 3 months.

## 2021-01-28 NOTE — Progress Notes (Signed)
[name] [address] [place] [address] Phone: [phone] Subjective:   INadine [name], am serving as a scribe for Dr. Antoine Primas. This visit occurred during the SARS-CoV-2 public health emergency.  Safety protocols were in place, including screening questions prior to the visit, additional usage of staff PPE, and extensive cleaning of exam room while observing appropriate contact time as indicated for disinfecting solutions.   I'm seeing this patient by the request  of:  Patient, No Pcp Per (Inactive)  CC: Neck and back pain follow-up  [id number]  Kristen [name] is a 39 y.o. female coming in with complaint of back and neck pain. OMT 12/04/2020. Patient states same areas are bothersome. No new complaints. Since last visit pain seems to be a little worse.  Medications patient has been prescribed: None   Taking:         Reviewed prior external information including notes and imaging from previsou exam, outside providers and external EMR if available.   As well as notes that were available from care everywhere and other healthcare systems.  Past medical history, social, surgical and family history all reviewed in electronic medical record.  No pertanent information unless stated regarding to the chief complaint.   Past Medical History:  Diagnosis Date   Hypothyroidism    Infertility, female    Medical history non-contributory     No Known Allergies   Review of Systems:  No headache, visual changes, nausea, vomiting, diarrhea, constipation, dizziness, abdominal pain, skin rash, fevers, chills, night sweats, weight loss, swollen lymph nodes, body aches, joint swelling, chest pain, shortness of breath, mood changes. POSITIVE muscle aches  Objective  Blood pressure 116/74, pulse 66, height 5\' 7"  (1.702 m), weight 163 lb (73.9 kg), SpO2 95 %, unknown if currently breastfeeding.   General: No apparent distress alert and oriented  x3 mood and affect normal, dressed appropriately.  HEENT: Pupils equal, extraocular movements intact  Respiratory: Patient's speak in full sentences and does not appear short of breath  Cardiovascular: No lower extremity edema, non tender, no erythema  Patient does have some tightness of the neck noted.  Increase in tightness noted of the right parascapular region.  Seems to be significant right greater than left today.  Does have inferior winging noted of the scapula.  Tightness of the trapezius noted.  Osteopathic findings  C2 flexed rotated and side bent right C6 flexed rotated and side bent left T3 extended rotated and side bent right inhaled rib T9 extended rotated and side bent right       Assessment and Plan:  Scapular dyskinesis Scapular dyskinesis noted.  Patient does have exacerbation noted.  Increasing tightness of the musculature.  Sent in Zanaflex to 2 mg to see if patient would benefit from this.  Still responds well though to osteopathic manipulation.  Increase activity slowly.  Follow-up again in 6 weeks secondary to the exacerbation   Nonallopathic problems  Decision today to treat with OMT was based on Physical Exam  After verbal consent patient was treated with HVLA, ME, FPR techniques in cervical, rib, thoracic, lumbar, and sacral  areas  Patient tolerated the procedure well with improvement in symptoms  Patient given exercises, stretches and lifestyle modifications  See medications in patient instructions if given  Patient will follow up in 4-8 weeks      The above documentation has been reviewed and is accurate and complete , DO  Note: This dictation was prepared with Dragon dictation along with smaller phrase technology. Any transcriptional errors that result from this process are unintentional.

## 2021-01-29 ENCOUNTER — Other Ambulatory Visit: Payer: Self-pay

## 2021-01-29 ENCOUNTER — Ambulatory Visit (INDEPENDENT_AMBULATORY_CARE_PROVIDER_SITE_OTHER): Payer: 59 | Admitting: Family Medicine

## 2021-01-29 ENCOUNTER — Encounter: Payer: Self-pay | Admitting: Family Medicine

## 2021-01-29 VITALS — BP 116/74 | HR 66 | Ht 67.0 in | Wt 163.0 lb

## 2021-01-29 DIAGNOSIS — M9901 Segmental and somatic dysfunction of cervical region: Secondary | ICD-10-CM

## 2021-01-29 DIAGNOSIS — M9902 Segmental and somatic dysfunction of thoracic region: Secondary | ICD-10-CM

## 2021-01-29 DIAGNOSIS — M9908 Segmental and somatic dysfunction of rib cage: Secondary | ICD-10-CM | POA: Diagnosis not present

## 2021-01-29 DIAGNOSIS — G2589 Other specified extrapyramidal and movement disorders: Secondary | ICD-10-CM

## 2021-01-29 MED ORDER — TIZANIDINE HCL 2 MG PO TABS: 2.0000 mg | ORAL_TABLET | Freq: Every day | ORAL | 0 refills | Status: DC

## 2021-01-29 NOTE — Patient Instructions (Signed)
Zanaflex 2mg  take at night Try to do exercises including barre class See you again in 5-6 weeks

## 2021-01-29 NOTE — Assessment & Plan Note (Signed)
Scapular dyskinesis noted.  Patient does have exacerbation noted.  Increasing tightness of the musculature.  Sent in Zanaflex to 2 mg to see if patient would benefit from this.  Still responds well though to osteopathic manipulation.  Increase activity slowly.  Follow-up again in 6 weeks secondary to the exacerbation

## 2021-02-27 ENCOUNTER — Other Ambulatory Visit: Payer: Self-pay | Admitting: Family Medicine

## 2021-03-05 ENCOUNTER — Ambulatory Visit: Payer: 59 | Admitting: Family Medicine

## 2021-03-12 NOTE — Progress Notes (Signed)
°  [name] [address] [place] [address] Phone: [phone] Subjective:   [name], am serving as a scribe for Dr. Antoine Primas. This visit occurred during the SARS-CoV-2 public health emergency.  Safety protocols were in place, including screening questions prior to the visit, additional usage of staff PPE, and extensive cleaning of exam room while observing appropriate contact time as indicated for disinfecting solutions.   I'm seeing this patient by the request  of:  Patient, No Pcp Per (Inactive)  CC: Back and neck pain follow-up  [id number]  Kristen [name] is a 41 y.o. female coming in with complaint of back and neck pain. OMT on 01/29/2021. Patient states same as usual. No new complaints.  Medications patient has been prescribed: Zanaflex  Taking: Intermittently         Past Medical History:  Diagnosis Date   Hypothyroidism    Infertility, female    Medical history non-contributory     No Known Allergies   Objective  Blood pressure 118/74, pulse 82, height 5\' 7"  (1.702 m), weight 167 lb (75.8 kg), SpO2 98 %, unknown if currently breastfeeding.   General: No apparent distress alert and oriented x3 mood and affect normal, dressed appropriately.  HEENT: Pupils equal, extraocular movements intact  Respiratory: Patient's speak in full sentences and does not appear short of breath  Cardiovascular: No lower extremity edema, non tender, no erythema  Patient does have still some tightness noted in the parascapular region and actually seems to be more bilateral today.  Mild tenderness in the thoracolumbar junction noted as well.  Mild tightness with sidebending of the neck bilaterally right greater than left.  Negative Spurling's  Osteopathic findings  C2 flexed rotated and side bent right C6 flexed rotated and side bent left T3 extended rotated and side bent right inhaled rib T9 extended rotated and side bent left inhaled  rib        Assessment and Plan:  Scapular dyskinesis Chronic, stable overall.  Discussed posture and ergonomics.  Has the muscle relaxers as needed.  Patient is continuing to improve and can see me again in 2 to 3 months.   Nonallopathic problems  Decision today to treat with OMT was based on Physical Exam  After verbal consent patient was treated with HVLA, ME, FPR techniques in cervical, rib, thoracic, areas  Patient tolerated the procedure well with improvement in symptoms  Patient given exercises, stretches and lifestyle modifications  See medications in patient instructions if given  Patient will follow up in 6-8 weeks      The above documentation has been reviewed and is accurate and complete , DO        Note: This dictation was prepared with Dragon dictation along with smaller phrase technology. Any transcriptional errors that result from this process are unintentional.

## 2021-03-13 ENCOUNTER — Ambulatory Visit: Payer: 59 | Admitting: Family Medicine

## 2021-03-13 ENCOUNTER — Other Ambulatory Visit: Payer: Self-pay

## 2021-03-13 VITALS — BP 118/74 | HR 82 | Ht 67.0 in | Wt 167.0 lb

## 2021-03-13 DIAGNOSIS — M9908 Segmental and somatic dysfunction of rib cage: Secondary | ICD-10-CM | POA: Diagnosis not present

## 2021-03-13 DIAGNOSIS — G2589 Other specified extrapyramidal and movement disorders: Secondary | ICD-10-CM

## 2021-03-13 DIAGNOSIS — M9901 Segmental and somatic dysfunction of cervical region: Secondary | ICD-10-CM | POA: Diagnosis not present

## 2021-03-13 DIAGNOSIS — M9902 Segmental and somatic dysfunction of thoracic region: Secondary | ICD-10-CM

## 2021-03-13 NOTE — Patient Instructions (Signed)
Good to see you! Thanks for bringing bodyguard she's cute Have fun at lunch Continue exercises See you again in 2-3 months

## 2021-03-13 NOTE — Assessment & Plan Note (Signed)
Chronic, stable overall.  Discussed posture and ergonomics.  Has the muscle relaxers as needed.  Patient is continuing to improve and can see me again in 2 to 3 months.

## 2021-05-09 NOTE — Progress Notes (Signed)
?Kristen Files D.O. ?[facility] ?[address] [place] [address] ?Phone: [phone] ?Subjective:   ?I, [name], am serving as a Neurosurgeon for Dr. Antoine Primas. ?This visit occurred during the SARS-CoV-2 public health emergency.  Safety protocols were in place, including screening questions prior to the visit, additional usage of staff PPE, and extensive cleaning of exam room while observing appropriate contact time as indicated for disinfecting solutions.  ? ?I'm seeing this patient by the request  of:  Patient, No Pcp Per (Inactive) ? ?CC: back neck pain  ? ?[id number]  ?Tarea Kristen [name] is a 41 y.o. female coming in with complaint of back and neck pain. OMT 03/13/2021. Patient states doing a little better. No new complaints. ? ?Medications patient has been prescribed: Zanaflex ? ?Taking: ? ? ?  ? ? ? ? ?Reviewed prior external information including notes and imaging from previsou exam, outside providers and external EMR if available.  ? ?As well as notes that were available from care everywhere and other healthcare systems. ? ?Past medical history, social, surgical and family history all reviewed in electronic medical record.  No pertanent information unless stated regarding to the chief complaint.  ? ?Past Medical History:  ?Diagnosis Date  ? Hypothyroidism   ? Infertility, female   ? Medical history non-contributory   ?  ?No Known Allergies ? ? ?Review of Systems: ? No headache, visual changes, nausea, vomiting, diarrhea, constipation, dizziness, abdominal pain, skin rash, fevers, chills, night sweats, weight loss, swollen lymph nodes, body aches, joint swelling, chest pain, shortness of breath, mood changes. POSITIVE muscle aches ? ?Objective  ?Blood pressure 108/70, pulse 76, height 5\' 7"  (1.702 m), weight 168 lb (76.2 kg), SpO2 98 %, unknown if currently breastfeeding. ?  ?General: No apparent distress alert and oriented x3 mood and affect normal, dressed appropriately.  ?HEENT: Pupils  equal, extraocular movements intact  ?Respiratory: Patient's speak in full sentences and does not appear short of breath  ?Cardiovascular: No lower extremity edema, non tender, no erythema  ?Neck exam has more tightness noted on the left side of the paraspinal area.  Patient does have tenderness noted.  Negative Spurling's.  5/5 strength of the upper extremities.  Mild tightness noted in the paraspinal musculature as well. ? ?Osteopathic findings ? ?C3 flexed rotated and side bent left ?C6 flexed rotated and side bent left ?T3 extended rotated and side bent left inhaled rib ?T6 extended rotated and side bent left ?L2 flexed rotated and side bent right ?Sacrum right on right ? ? ?  ?Assessment and Plan: ? ?Scapular dyskinesis ?Scapular dyskinesis noted.  Discussed icing regimen and home exercises, which activities to do which wants to avoid.  Increase activity slowly.  Patient is doing very well overall.  Follow-up again in 2 to 3 months. ?  ? ?Nonallopathic problems ? ?Decision today to treat with OMT was based on Physical Exam ? ?After verbal consent patient was treated with HVLA, ME, FPR techniques in cervical, rib, thoracic, lumbar, and sacral  areas ? ?Patient tolerated the procedure well with improvement in symptoms ? ?Patient given exercises, stretches and lifestyle modifications ? ?See medications in patient instructions if given ? ?Patient will follow up in 4-8 weeks ? ?  ? ? ?The above documentation has been reviewed and is accurate and complete , DO  ? ? ?  ? ? Note: This dictation was prepared with Dragon dictation along with smaller phrase technology. Any transcriptional errors that result from this  process are unintentional.    ?  ?  ? ?

## 2021-05-12 ENCOUNTER — Ambulatory Visit: Payer: 59 | Admitting: Family Medicine

## 2021-05-12 ENCOUNTER — Other Ambulatory Visit: Payer: Self-pay

## 2021-05-12 VITALS — BP 108/70 | HR 76 | Ht 67.0 in | Wt 168.0 lb

## 2021-05-12 DIAGNOSIS — M9904 Segmental and somatic dysfunction of sacral region: Secondary | ICD-10-CM

## 2021-05-12 DIAGNOSIS — M9908 Segmental and somatic dysfunction of rib cage: Secondary | ICD-10-CM | POA: Diagnosis not present

## 2021-05-12 DIAGNOSIS — M9902 Segmental and somatic dysfunction of thoracic region: Secondary | ICD-10-CM | POA: Diagnosis not present

## 2021-05-12 DIAGNOSIS — G2589 Other specified extrapyramidal and movement disorders: Secondary | ICD-10-CM

## 2021-05-12 DIAGNOSIS — M9903 Segmental and somatic dysfunction of lumbar region: Secondary | ICD-10-CM | POA: Diagnosis not present

## 2021-05-12 DIAGNOSIS — M9901 Segmental and somatic dysfunction of cervical region: Secondary | ICD-10-CM

## 2021-05-12 NOTE — Patient Instructions (Signed)
Good to see you! ?Much better overall ?Keep it up ?See you again in 2-3 months ?

## 2021-05-12 NOTE — Assessment & Plan Note (Signed)
Scapular dyskinesis noted.  Discussed icing regimen and home exercises, which activities to do which wants to avoid.  Increase activity slowly.  Patient is doing very well overall.  Follow-up again in 2 to 3 months. ?

## 2021-07-15 NOTE — Progress Notes (Unsigned)
  [name] [address] [place] [address] Phone: [phone] Subjective:   [name], am serving as a scribe for Dr. Antoine Primas.   I'm seeing this patient by the request  of:  Patient, No Pcp Per (Inactive)  CC: Back and neck pain follow-up  [id number]  Daniah MARYELIZABETH EBERLE is a 41 y.o. female coming in with complaint of back and neck pain. OMT 05/12/2021. Patient states that she is doing well. Not using medications prescribed to her.  Patient states overall doing relatively well.  Describes some mild tightness but nothing severe.  The patient is accompanied with her daughter.  Medications patient has been prescribed: Zanaflex, Mobic  Taking:         Reviewed prior external information including notes and imaging from previsou exam, outside providers and external EMR if available.   As well as notes that were available from care everywhere and other healthcare systems.  Past medical history, social, surgical and family history all reviewed in electronic medical record.  No pertanent information unless stated regarding to the chief complaint.   Past Medical History:  Diagnosis Date   Hypothyroidism    Infertility, female    Medical history non-contributory     No Known Allergies   Objective  Blood pressure 98/70, pulse 63, height 5\' 7"  (1.702 m), weight 167 lb (75.8 kg), SpO2 97 %, unknown if currently breastfeeding.   General: No apparent distress alert and oriented x3 mood and affect normal, dressed appropriately.  HEENT: Pupils equal, extraocular movements intact  Respiratory: Patient's speak in full sentences and does not appear short of breath  Cardiovascular: No lower extremity edema, non tender, no erythema  Patient does have some mild loss of lordosis of the neck.  Some tightness still noted in the parascapular region right greater than left.  Mild scapular dyskinesis still noted right greater than left but still  improvement from initial presentation.  Patient's low back does have some tightness noted in the paralumbar region mostly in the thoracolumbar juncture  Osteopathic findings  C2 flexed rotated and side bent right T3 extended rotated and side bent right inhaled rib L1 flexed rotated and side bent right Sacrum right on right       Assessment and Plan:  Scapular dyskinesis Patient is doing very well.  Stable at this point.  No significant changes.  Has had anti-inflammatories and muscle relaxers previously.  Patient to follow-up with me again in 2 to 3 months   Nonallopathic problems  Decision today to treat with OMT was based on Physical Exam  After verbal consent patient was treated with HVLA, ME, FPR techniques in cervical, rib, thoracic, lumbar, and sacral  areas  Patient tolerated the procedure well with improvement in symptoms  Patient given exercises, stretches and lifestyle modifications  See medications in patient instructions if given  Patient will follow up in 4-8 weeks     The above documentation has been reviewed and is accurate and complete , DO        Note: This dictation was prepared with Dragon dictation along with smaller phrase technology. Any transcriptional errors that result from this process are unintentional.

## 2021-07-16 ENCOUNTER — Ambulatory Visit: Payer: 59 | Admitting: Family Medicine

## 2021-07-16 ENCOUNTER — Encounter: Payer: Self-pay | Admitting: Family Medicine

## 2021-07-16 VITALS — BP 98/70 | HR 63 | Ht 67.0 in | Wt 167.0 lb

## 2021-07-16 DIAGNOSIS — M9902 Segmental and somatic dysfunction of thoracic region: Secondary | ICD-10-CM | POA: Diagnosis not present

## 2021-07-16 DIAGNOSIS — M9901 Segmental and somatic dysfunction of cervical region: Secondary | ICD-10-CM | POA: Diagnosis not present

## 2021-07-16 DIAGNOSIS — M9904 Segmental and somatic dysfunction of sacral region: Secondary | ICD-10-CM

## 2021-07-16 DIAGNOSIS — M9908 Segmental and somatic dysfunction of rib cage: Secondary | ICD-10-CM

## 2021-07-16 DIAGNOSIS — G2589 Other specified extrapyramidal and movement disorders: Secondary | ICD-10-CM | POA: Diagnosis not present

## 2021-07-16 DIAGNOSIS — M9903 Segmental and somatic dysfunction of lumbar region: Secondary | ICD-10-CM

## 2021-07-16 NOTE — Patient Instructions (Signed)
Good to see you Thanks for the energy See me in 2-3 months

## 2021-07-16 NOTE — Assessment & Plan Note (Signed)
Patient is doing very well.  Stable at this point.  No significant changes.  Has had anti-inflammatories and muscle relaxers previously.  Patient to follow-up with me again in 2 to 3 months

## 2021-09-17 ENCOUNTER — Ambulatory Visit: Payer: 59 | Admitting: Family Medicine

## 2021-10-01 NOTE — Progress Notes (Unsigned)
  [name] [address] [place] [address] Phone: [phone] Subjective:   Kristen [name], am serving as a scribe for Dr. Antoine Primas.  I'm seeing this patient by the request  of:  Patient, No Pcp Per  CC: Back and neck pain follow-up  [id number]  Kristen [name] is a 41 y.o. female coming in with complaint of back and neck pain. OMT 07/16/2021. Patient states that pain can increase from time to time but nothing that is stopping her from activity.  Has not had to use the medication for weeks.  Medications patient has been prescribed: None           Past Medical History:  Diagnosis Date   Hypothyroidism    Infertility, female    Medical history non-contributory     No Known Allergies   Review of Systems:  No headache, visual changes, nausea, vomiting, diarrhea, constipation, dizziness, abdominal pain, skin rash, fevers, chills, night sweats, weight loss, swollen lymph nodes, body aches, joint swelling, chest pain, shortness of breath, mood changes. POSITIVE muscle aches  Objective  Blood pressure 104/76, pulse 74, height 5\' 7"  (1.702 m), weight 157 lb (71.2 kg), SpO2 98 %, unknown if currently breastfeeding.   General: No apparent distress alert and oriented x3 mood and affect normal, dressed appropriately.  HEENT: Pupils equal, extraocular movements intact  Respiratory: Patient's speak in full sentences and does not appear short of breath  Cardiovascular: No lower extremity edema, non tender, no erythema  Gait hip: ROM IR: 45 Deg, ER: 45 Deg, Flexion: 120 Deg, Extension: 100 Deg, Abduction: 45 Deg, Adduction: 45 Deg Strength IR: 5/5, ER: 5/5, Flexion: 5/5, Extension: 5/5, Abduction: 5/5, Adduction: 5/5 Pelvic alignment unremarkable to inspection and palpation. Standing hip rotation and gait without trendelenburg sign / unsteadiness. Greater trochanter without tenderness to palpation. No tenderness over piriformis and  greater trochanter. No pain with FABER or FADIR. No SI joint tenderness and normal minimal SI movement. MSK:  Back exam does have some tightness noted over the paraspinal musculature otherwise.  Tightness in the neck noted right greater than left.  Osteopathic findings  C2 flexed rotated and side bent right C7 flexed rotated and side bent left T3 extended rotated and side bent right inhaled rib T8 extended rotated and side bent left L1 flexed rotated and side bent right Sacrum right on right       Assessment and Plan:  Scapular dyskinesis Scapular dyskinesis.  Discussed icing regimen and home exercises.  Still responding extremely well to osteopathic manipulation.  Not using any medications on a regular basis but has the muscle relaxer if needed.  Follow-up with me again in 3 months    Nonallopathic problems  Decision today to treat with OMT was based on Physical Exam  After verbal consent patient was treated with HVLA, ME, FPR techniques in cervical, rib, thoracic, lumbar, and sacral  areas  Patient tolerated the procedure well with improvement in symptoms  Patient given exercises, stretches and lifestyle modifications  See medications in patient instructions if given  Patient will follow up in 12 weeks     The above documentation has been reviewed and is accurate and complete , DO         Note: This dictation was prepared with Dragon dictation along with smaller phrase technology. Any transcriptional errors that result from this process are unintentional.

## 2021-10-02 ENCOUNTER — Ambulatory Visit: Payer: 59 | Admitting: Family Medicine

## 2021-10-02 ENCOUNTER — Encounter: Payer: Self-pay | Admitting: Family Medicine

## 2021-10-02 VITALS — BP 104/76 | HR 74 | Ht 67.0 in | Wt 157.0 lb

## 2021-10-02 DIAGNOSIS — M9904 Segmental and somatic dysfunction of sacral region: Secondary | ICD-10-CM | POA: Diagnosis not present

## 2021-10-02 DIAGNOSIS — M9908 Segmental and somatic dysfunction of rib cage: Secondary | ICD-10-CM

## 2021-10-02 DIAGNOSIS — G2589 Other specified extrapyramidal and movement disorders: Secondary | ICD-10-CM

## 2021-10-02 DIAGNOSIS — M9903 Segmental and somatic dysfunction of lumbar region: Secondary | ICD-10-CM | POA: Diagnosis not present

## 2021-10-02 DIAGNOSIS — M9902 Segmental and somatic dysfunction of thoracic region: Secondary | ICD-10-CM | POA: Diagnosis not present

## 2021-10-02 DIAGNOSIS — M9901 Segmental and somatic dysfunction of cervical region: Secondary | ICD-10-CM

## 2021-10-02 NOTE — Assessment & Plan Note (Signed)
Scapular dyskinesis.  Discussed icing regimen and home exercises.  Still responding extremely well to osteopathic manipulation.  Not using any medications on a regular basis but has the muscle relaxer if needed.  Follow-up with me again in 3 months

## 2021-10-02 NOTE — Patient Instructions (Signed)
Good to see you See me in 3 months

## 2021-12-25 NOTE — Progress Notes (Deleted)
  [name] [address] [place] [address] Phone: [phone] Subjective:    I'm seeing this patient by the request  of:  Patient, No Pcp Per  CC:   [id number]  Kristen [name] is a 41 y.o. female coming in with complaint of back and neck pain. OMT 10/02/2021. Patient states   Medications patient has been prescribed: None  Taking:         Reviewed prior external information including notes and imaging from previsou exam, outside providers and external EMR if available.   As well as notes that were available from care everywhere and other healthcare systems.  Past medical history, social, surgical and family history all reviewed in electronic medical record.  No pertanent information unless stated regarding to the chief complaint.   Past Medical History:  Diagnosis Date   Hypothyroidism    Infertility, female    Medical history non-contributory     No Known Allergies   Review of Systems:  No headache, visual changes, nausea, vomiting, diarrhea, constipation, dizziness, abdominal pain, skin rash, fevers, chills, night sweats, weight loss, swollen lymph nodes, body aches, joint swelling, chest pain, shortness of breath, mood changes. POSITIVE muscle aches  Objective  unknown if currently breastfeeding.   General: No apparent distress alert and oriented x3 mood and affect normal, dressed appropriately.  HEENT: Pupils equal, extraocular movements intact  Respiratory: Patient's speak in full sentences and does not appear short of breath  Cardiovascular: No lower extremity edema, non tender, no erythema  Gait MSK:  Back   Osteopathic findings  C2 flexed rotated and side bent right C6 flexed rotated and side bent left T3 extended rotated and side bent right inhaled rib T9 extended rotated and side bent left L2 flexed rotated and side bent right Sacrum right on right       Assessment and Plan:  No problem-specific  Assessment & Plan notes found for this encounter.    Nonallopathic problems  Decision today to treat with OMT was based on Physical Exam  After verbal consent patient was treated with HVLA, ME, FPR techniques in cervical, rib, thoracic, lumbar, and sacral  areas  Patient tolerated the procedure well with improvement in symptoms  Patient given exercises, stretches and lifestyle modifications  See medications in patient instructions if given  Patient will follow up in 4-8 weeks             Note: This dictation was prepared with Dragon dictation along with smaller phrase technology. Any transcriptional errors that result from this process are unintentional.

## 2021-12-30 ENCOUNTER — Ambulatory Visit: Payer: 59 | Admitting: Family Medicine

## 2022-01-05 NOTE — Progress Notes (Unsigned)
  [name] [address] [place] [address] Phone: [phone] Subjective:   [name], am serving as a scribe for Dr. Antoine Primas.  I'm seeing this patient by the request  of:  Patient, No Pcp Per  CC: Back and neck pain follow-up  [id number]  Kristen [name] is a 41 y.o. female coming in with complaint of back and neck pain. OMT 10/02/2021. 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   Medications patient has been prescribed: None  Taking:         Reviewed prior external information including notes and imaging from previsou exam, outside providers and external EMR if available.   As well as notes that were available from care everywhere and other healthcare systems.  Past medical history, social, surgical and family history all reviewed in electronic medical record.  No pertanent information unless stated regarding to the chief complaint.   Past Medical History:  Diagnosis Date   Hypothyroidism    Infertility, female    Medical history non-contributory     No Known Allergies   Review of Systems:  No headache, visual changes, nausea, vomiting, diarrhea, constipation, dizziness, abdominal pain, skin rash, fevers, chills, night sweats, weight loss, swollen lymph nodes, body aches, joint swelling, chest pain, shortness of breath, mood changes. POSITIVE muscle aches  Objective  Blood pressure 98/72, pulse 61, height 5\' 7"  (1.702 m), weight 150 lb (68 kg), SpO2 98 %, unknown if currently breastfeeding.   General: No apparent distress alert and oriented x3 mood and affect normal, dressed appropriately.  HEENT: Pupils equal, extraocular movements intact  Respiratory: Patient's speak in full sentences and does not appear short of breath  Cardiovascular: No lower extremity edema, non tender, no erythema  Back exam does have some mild loss of lordosis. More than the scapular dyskinesis still noted  right greater than left.  Tenderness to palpation over the medial aspect of the scapula.  Osteopathic findings  C7 flexed rotated and side bent right T3 extended rotated and side bent right inhaled rib T8 extended rotated and side bent left L2 flexed rotated and side bent right Sacrum right on right    Assessment and Plan:  Scapular dyskinesis Continue tightness noted.  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  We discussed with patient still to continue to work on the strengthening aspect.  Went over some of the other exercises.  Patient does respond extremely well to osteopathic manipulation.  Would like to see patient back again in 2 months for further evaluation and treatment.    Nonallopathic problems  Decision today to treat with OMT was based on Physical Exam  After verbal consent patient was treated with HVLA, ME, FPR techniques in cervical, rib, thoracic, lumbar, and sacral  areas  Patient tolerated the procedure well with improvement in symptoms  Patient given exercises, stretches and lifestyle modifications  See medications in patient instructions if given  Patient will follow up in 4-8 weeks    The above documentation has been reviewed and is accurate and complete Judi Saa, DO          Note: This dictation was prepared with Dragon dictation along with smaller phrase technology. Any transcriptional errors that result from this process are unintentional.

## 2022-01-07 ENCOUNTER — Ambulatory Visit: Payer: 59 | Admitting: Family Medicine

## 2022-01-07 VITALS — BP 98/72 | HR 61 | Ht 67.0 in | Wt 150.0 lb

## 2022-01-07 DIAGNOSIS — M9904 Segmental and somatic dysfunction of sacral region: Secondary | ICD-10-CM | POA: Diagnosis not present

## 2022-01-07 DIAGNOSIS — M9908 Segmental and somatic dysfunction of rib cage: Secondary | ICD-10-CM | POA: Diagnosis not present

## 2022-01-07 DIAGNOSIS — M9901 Segmental and somatic dysfunction of cervical region: Secondary | ICD-10-CM | POA: Diagnosis not present

## 2022-01-07 DIAGNOSIS — G2589 Other specified extrapyramidal and movement disorders: Secondary | ICD-10-CM | POA: Diagnosis not present

## 2022-01-07 DIAGNOSIS — M9902 Segmental and somatic dysfunction of thoracic region: Secondary | ICD-10-CM

## 2022-01-07 DIAGNOSIS — M9903 Segmental and somatic dysfunction of lumbar region: Secondary | ICD-10-CM

## 2022-01-07 NOTE — Patient Instructions (Signed)
Good to see you Tell someone else to do housework See me in 2 months

## 2022-01-07 NOTE — Assessment & Plan Note (Signed)
Continue tightness noted.  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  We discussed with patient still to continue to work on the strengthening aspect.  Went over some of the other exercises.  Patient does respond extremely well to osteopathic manipulation.  Would like to see patient back again in 2 months for further evaluation and treatment.

## 2022-02-16 ENCOUNTER — Emergency Department (HOSPITAL_BASED_OUTPATIENT_CLINIC_OR_DEPARTMENT_OTHER): Payer: 59

## 2022-02-16 ENCOUNTER — Emergency Department (HOSPITAL_BASED_OUTPATIENT_CLINIC_OR_DEPARTMENT_OTHER): Payer: 59 | Admitting: Radiology

## 2022-02-16 ENCOUNTER — Other Ambulatory Visit: Payer: Self-pay

## 2022-02-16 ENCOUNTER — Emergency Department (HOSPITAL_BASED_OUTPATIENT_CLINIC_OR_DEPARTMENT_OTHER)
Admission: EM | Admit: 2022-02-16 | Discharge: 2022-02-16 | Disposition: A | Discharge status: Home / Self Care | Payer: 59 | Attending: Emergency Medicine | Admitting: Emergency Medicine

## 2022-02-16 ENCOUNTER — Encounter (HOSPITAL_BASED_OUTPATIENT_CLINIC_OR_DEPARTMENT_OTHER): Payer: Self-pay

## 2022-02-16 DIAGNOSIS — W010XXA Fall on same level from slipping, tripping and stumbling without subsequent striking against object, initial encounter: Secondary | ICD-10-CM | POA: Diagnosis not present

## 2022-02-16 DIAGNOSIS — S43014A Anterior dislocation of right humerus, initial encounter: Secondary | ICD-10-CM | POA: Diagnosis not present

## 2022-02-16 DIAGNOSIS — S4291XA Fracture of right shoulder girdle, part unspecified, initial encounter for closed fracture: Secondary | ICD-10-CM

## 2022-02-16 DIAGNOSIS — S4991XA Unspecified injury of right shoulder and upper arm, initial encounter: Secondary | ICD-10-CM | POA: Diagnosis present

## 2022-02-16 MED ORDER — PROPOFOL 10 MG/ML IV BOLUS
INTRAVENOUS | Status: AC | PRN
  Administered 2022-02-16: 40 mg via INTRAVENOUS

## 2022-02-16 MED ORDER — HYDROCODONE-ACETAMINOPHEN 5-325 MG PO TABS: 1.0000 | ORAL_TABLET | Freq: Four times a day (QID) | ORAL | 0 refills | Status: DC | PRN

## 2022-02-16 MED ORDER — PROPOFOL 10 MG/ML IV BOLUS
0.5000 mg/kg | Freq: Once | INTRAVENOUS | Status: AC
  Administered 2022-02-16: 40 mg via INTRAVENOUS
  Filled 2022-02-16: qty 20

## 2022-02-16 NOTE — Discharge Instructions (Addendum)
Wear arm sling until followed up by orthopedics.  Ice for 20 minutes every 2 hours while awake for the next 3 days.  Take hydrocodone as prescribed as needed for pain.  Follow-up with orthopedic surgery in the next 3 to 4 days.  The contact information for Dr. Doreatha Martin has been provided in this discharge summary for you to call on Tuesday and make these arrangements.

## 2022-02-16 NOTE — ED Triage Notes (Signed)
Pt slipped on glowstick approx 1 hour ago and landed on right shoulder. Pt states that she thinks shoulder is dislocated. Pt does have some deformity to right shoulder. +pulse; + cap refill, able to move digits without difficulty in RUE.

## 2022-02-16 NOTE — ED Provider Notes (Signed)
[facility] Provider Note   CSN: [id number] Arrival date & time: 02/16/22  0308     History  Chief Complaint  Patient presents with   Shoulder Injury    Kristen [name] is a 42 y.o. female.  Patient is a 42 year old female presenting with a right shoulder injury.  She reports stepping on a child's toy, then falling.  She reports pain to the right shoulder.  She denies numbness or tingling.  Pain is worse with any movement with no alleviating factors.  She denies other injury.  The history is provided by the patient.       Home Medications Prior to Admission medications   Medication Sig Start Date End Date Taking? Authorizing Provider  ferrous gluconate (FERGON) 240 (27 FE) MG tablet Take 240 mg by mouth daily.    [provider]  levothyroxine (SYNTHROID, LEVOTHROID) 88 MCG tablet Take 88 mcg by mouth daily before breakfast.    [provider]  meloxicam (MOBIC) 15 MG tablet TAKE 1 TABLET BY MOUTH EVERY DAY 01/09/20   Lyndal Pulley, DO  oxyCODONE-acetaminophen (PERCOCET/ROXICET) 5-325 MG tablet Take 2 tablets by mouth every 4 (four) hours as needed (pain scale > 7). 08/19/17   Allyn Kenner, DO  Prenatal Vit-Fe Fumarate-FA (MULTIVITAMIN-PRENATAL) 27-0.8 MG TABS tablet Take 1 tablet by mouth daily at 12 noon.    [provider]  tiZANidine (ZANAFLEX) 2 MG tablet TAKE 1 TABLET BY MOUTH AT BEDTIME. 02/27/21   Lyndal Pulley, DO      Allergies    Patient has no known allergies.    Review of Systems   Review of Systems  All other systems reviewed and are negative.   Physical Exam Updated Vital Signs BP 108/86 (BP Location: Left Arm)   Pulse 77   Temp 98.4 F (36.9 C)   Resp (!) 21   Ht 5\' 7"  (1.702 m)   Wt 68 kg   LMP 02/02/2022 (Exact Date)   SpO2 100%   BMI 23.49 kg/m  Physical Exam Vitals and nursing note reviewed.  Constitutional:      General: She is not in acute distress.    Appearance: Normal  appearance. She is well-developed. She is not ill-appearing or diaphoretic.  HENT:     Head: Normocephalic and atraumatic.  Cardiovascular:     Rate and Rhythm: Normal rate and regular rhythm.     Heart sounds: No murmur heard.    No friction rub. No gallop.  Pulmonary:     Effort: Pulmonary effort is normal. No respiratory distress.     Breath sounds: Normal breath sounds. No wheezing.  Abdominal:     General: Bowel sounds are normal. There is no distension.     Palpations: Abdomen is soft.     Tenderness: There is no abdominal tenderness.  Musculoskeletal:        General: Normal range of motion.     Cervical back: Normal range of motion and neck supple.     Comments: The right shoulder with apparent glenohumeral dislocation.  Ulnar and radial pulses are easily palpable and motor and sensation are intact throughout the entire hand.  Skin:    General: Skin is warm and dry.  Neurological:     General: No focal deficit present.     Mental Status: She is alert and oriented to person, place, and time.     ED Results / Procedures / Treatments   Labs (all labs ordered are listed, but  only abnormal results are displayed) Labs Reviewed - No data to display  EKG None  Radiology DG Shoulder Right  Result Date: 02/16/2022 CLINICAL DATA:  Status post fall. EXAM: RIGHT SHOULDER - 2+ VIEW COMPARISON:  None Available. FINDINGS: There is anterior dislocation of the right humeral head with respect to the right glenoid. Acute fracture deformity is seen involving the greater tubercle of the right humeral head. There is no evidence of arthropathy or other focal bone abnormality. Mild lateral soft tissue swelling is noted. IMPRESSION: Anterior dislocation of the right shoulder with an associated acute fracture of the greater tubercle of the right humeral head. Electronically Signed   By: Virgina Norfolk M.D.   On: 02/16/2022 03:31    Procedures Reduction of dislocation  Date/Time: 02/16/2022 4:43  AM  Performed by: Veryl Speak, MD Authorized by: Veryl Speak, MD  Consent: Verbal consent obtained. Written consent obtained. Risks and benefits: risks, benefits and alternatives were discussed Consent given by: patient Patient understanding: patient states understanding of the procedure being performed Patient consent: the patient's understanding of the procedure matches consent given Procedure consent: procedure consent matches procedure scheduled Relevant documents: relevant documents present and verified Test results: test results available and properly labeled Patient identity confirmed: verbally with patient, arm band and hospital-assigned identification number Time out: Immediately prior to procedure a "time out" was called to verify the correct patient, procedure, equipment, support staff and site/side marked as required. Local anesthesia used: no  Anesthesia: Local anesthesia used: no  Sedation: Patient sedated: yes Sedation type: moderate (conscious) sedation Sedatives: propofol Sedation start date/time: 02/16/2022 3:45 AM Sedation end date/time: 02/16/2022 4:00 AM  Patient tolerance: patient tolerated the procedure well with no immediate complications       Medications Ordered in ED Medications  propofol (DIPRIVAN) 10 mg/mL bolus/IV push 34 mg (has no administration in time range)    ED Course/ Medical Decision Making/ A&P  Patient ending with complaints of a right shoulder injury.  She apparently stepped on a glow stick, then fell and caught herself with an outstretched arm.  X-rays show a right glenohumeral dislocation with an avulsion fracture of the proximal humerus.  This was reduced under conscious sedation.  Please see note above.  Successful reduction confirmed with postreduction x-rays.  Patient placed in an arm sling and will follow-up with orthopedics later this week.  Final Clinical Impression(s) / ED Diagnoses Final diagnoses:  None    Rx / DC  Orders ED Discharge Orders     None         Veryl Speak, MD 02/16/22 [phone]

## 2022-02-16 NOTE — ED Notes (Signed)
Pt A&O4, voiced "major" reduction in pain. Advises R shoulder "still sore, but much better." +cap refill, +pulses

## 2022-02-16 NOTE — ED Notes (Signed)
E-Consent signed

## 2022-02-16 NOTE — ED Notes (Signed)
RT present for conscious/procedural sedation for right shoulder reduction. Pt placed on ETCO2 [place] 4 Lpm for preoxygenation, BVM setup and ready, airway cart outside room. Pts BLBS clear, respiratory status stable throughout procedure.RT will continue to monitor while at [facility] ED.

## 2022-02-18 ENCOUNTER — Other Ambulatory Visit: Payer: Self-pay | Admitting: Orthopedic Surgery

## 2022-02-18 DIAGNOSIS — S4291XA Fracture of right shoulder girdle, part unspecified, initial encounter for closed fracture: Secondary | ICD-10-CM

## 2022-02-19 ENCOUNTER — Ambulatory Visit
Admission: RE | Admit: 2022-02-19 | Discharge: 2022-02-19 | Disposition: A | Discharge status: Home / Self Care | Payer: 59 | Source: Ambulatory Visit | Attending: Orthopedic Surgery | Admitting: Orthopedic Surgery

## 2022-02-19 DIAGNOSIS — S4291XA Fracture of right shoulder girdle, part unspecified, initial encounter for closed fracture: Secondary | ICD-10-CM

## 2022-03-03 ENCOUNTER — Telehealth: Payer: Self-pay | Admitting: Family Medicine

## 2022-03-03 NOTE — Telephone Encounter (Signed)
Patient called stating that on January 1st she dislocated her shoulder.  She has been seeing Raliegh Ip for treatment.  She is scheduled for a regular follow up/MSK with Dr Tamala Julian on 03/10/22. She is seen for her back but is also having neck pain (possibly coming from her shoulder).  She asked if she is still able to come her for appointment or if she needs to wait for it to heal before seeing Dr Tamala Julian?  Please advise.

## 2022-03-03 NOTE — Telephone Encounter (Signed)
Spoke with patient. She is going to be in a sling and unable to move her shoulder at this time. Would like appt in February. Scheduled for when she comes out of sling.

## 2022-03-10 ENCOUNTER — Ambulatory Visit: Payer: 59 | Admitting: Family Medicine

## 2022-03-30 NOTE — Progress Notes (Unsigned)
[facility] [place] Phone: [phone] Subjective:   Kristen Kristen [name], am serving as a scribe for Dr. Hulan Saas.  I'm seeing this patient by the request  of:  Patient, Kristen Kristen [name]  CC: Neck pain and back pain.  [id number]  Kristen Kristen [name] is a 42 y.o. female coming in with complaint of back and neck pain. OMT 01/07/2022.Dislocated shoulder February 16, 2022.  Patient states that she is unable to lift arm overhead and out to the side. Pain over anterior and lateral aspect. R side of neck is tight and painful.   Medications patient has been prescribed: None  Taking:  CT R shoulder 02/19/2022 IMPRESSION: 1. Reduced right glenohumeral dislocation with stable nondisplaced mildly comminuted fracture of the greater tuberosity. 2. Kristen [name] evidence of glenoid fracture. 3. Kristen [name] focal muscular abnormalities identified. 4. Nonspecific soft tissue stranding in the right axilla without focal fluid collection, foreign body or soft tissue emphysema.         Reviewed prior external information including notes and imaging from previsou exam, outside providers and external EMR if available.   As well as notes that were available from care everywhere and other healthcare systems.  Past medical history, social, surgical and family history all reviewed in electronic medical record.  Kristen [name] pertanent information unless stated regarding to the chief complaint.   Past Medical History:  Diagnosis Date   Hypothyroidism    Infertility, female    Medical history non-contributory     Kristen [name] Known Allergies   Review of Systems:  Kristen [name] headache, visual changes, nausea, vomiting, diarrhea, constipation, dizziness, abdominal pain, skin rash, fevers, chills, night sweats, weight loss, swollen lymph nodes, body aches, joint swelling, chest pain, shortness of breath, mood changes. POSITIVE muscle aches  Objective  Blood pressure 112/82, pulse 62, height 5' 7"$   (1.702 m), weight 154 lb (69.9 kg), SpO2 98 %, unknown if currently breastfeeding.   General: Kristen [name] apparent distress alert and oriented x3 mood and affect normal, dressed appropriately.  HEENT: Pupils equal, extraocular movements intact  Respiratory: Patient's speak in full sentences and does not appear short of breath  Cardiovascular: Kristen [name] lower extremity edema, non tender, Kristen [name] erythema  Neck exam does have some loss of lordosis.  Significant tightness noted in the right paraspinal musculature.  Increasing tightness noted of the lower back in the thoracolumbar junction noted.  Seems to be more right greater than left. Significant guarding noted of the right shoulder.  Limited muscular skeletal ultrasound was performed and interpreted by Hulan Saas, M  Limited ultrasound shows the patient still has cortical irregularities noted of the humeral head. Rotator cuff including supraspinatus seems to be intact. Impression: Findings consistent with a humeral head fracture but Kristen [name] significant callus formation noted.   Osteopathic findings  C2 flexed rotated and side bent right C3 flexed rotated and side bent left C6 flexed rotated and side bent left T3 extended rotated and side bent right inhaled rib T9 extended rotated and side bent right L2 flexed rotated and side bent right Sacrum right on right       Assessment and Plan:  Scapular dyskinesis Exacerbation secondary to the humeral head fracture.  The patient is starting formal physical therapy for that.  Has not been able to be as active and unfortunately not doing the exercises.  Do feel that physical therapy can be helpful.  Discussed icing regimen and home exercises, increase activity as tolerated.  Follow-up again in  6 to 8 weeks  Closed comminuted right humeral fracture Humeral head fracture noted.  Discus patient will be doing the exercises and physical therapy within our department.  Sed icing regimen and home exercises, which  activities to do and which ones to avoid.  Follow-up again in 6 to 8 weeks once weekly vitamin D given secondary to the limited.  Bone healing noted on exam.    Nonallopathic problems  Decision today to treat with OMT was based on Physical Exam  After verbal consent patient was treated with HVLA, ME, FPR techniques in cervical, rib, thoracic, lumbar, and sacral  areas  Patient tolerated the procedure well with improvement in symptoms  Patient given exercises, stretches and lifestyle modifications  See medications in patient instructions if given  Patient will follow up in 4-8 weeks     The above documentation has been reviewed and is accurate and complete Kristen Pulley, DO         Note: This dictation was prepared with Dragon dictation along with smaller phrase technology. Any transcriptional errors that result from this process are unintentional.

## 2022-03-31 ENCOUNTER — Ambulatory Visit: Payer: Self-pay

## 2022-03-31 ENCOUNTER — Ambulatory Visit: Payer: 59 | Admitting: Family Medicine

## 2022-03-31 ENCOUNTER — Encounter: Payer: Self-pay | Admitting: Family Medicine

## 2022-03-31 VITALS — BP 112/82 | HR 62 | Ht 67.0 in | Wt 154.0 lb

## 2022-03-31 DIAGNOSIS — S42351A Displaced comminuted fracture of shaft of humerus, right arm, initial encounter for closed fracture: Secondary | ICD-10-CM | POA: Insufficient documentation

## 2022-03-31 DIAGNOSIS — M9903 Segmental and somatic dysfunction of lumbar region: Secondary | ICD-10-CM

## 2022-03-31 DIAGNOSIS — M9901 Segmental and somatic dysfunction of cervical region: Secondary | ICD-10-CM | POA: Diagnosis not present

## 2022-03-31 DIAGNOSIS — G2589 Other specified extrapyramidal and movement disorders: Secondary | ICD-10-CM

## 2022-03-31 DIAGNOSIS — M9904 Segmental and somatic dysfunction of sacral region: Secondary | ICD-10-CM | POA: Diagnosis not present

## 2022-03-31 DIAGNOSIS — M9908 Segmental and somatic dysfunction of rib cage: Secondary | ICD-10-CM | POA: Diagnosis not present

## 2022-03-31 DIAGNOSIS — M25511 Pain in right shoulder: Secondary | ICD-10-CM | POA: Diagnosis not present

## 2022-03-31 DIAGNOSIS — M9902 Segmental and somatic dysfunction of thoracic region: Secondary | ICD-10-CM | POA: Diagnosis not present

## 2022-03-31 DIAGNOSIS — S42424A Nondisplaced comminuted supracondylar fracture without intercondylar fracture of right humerus, initial encounter for closed fracture: Secondary | ICD-10-CM

## 2022-03-31 MED ORDER — VITAMIN D (ERGOCALCIFEROL) 1.25 MG (50000 UNIT) PO CAPS: 50000.0000 [IU] | ORAL_CAPSULE | ORAL | 0 refills | Status: DC

## 2022-03-31 NOTE — Assessment & Plan Note (Signed)
Exacerbation secondary to the humeral head fracture.  The patient is starting formal physical therapy for that.  Has not been able to be as active and unfortunately not doing the exercises.  Do feel that physical therapy can be helpful.  Discussed icing regimen and home exercises, increase activity as tolerated.  Follow-up again in 6 to 8 weeks

## 2022-03-31 NOTE — Assessment & Plan Note (Addendum)
Humeral head fracture noted.  Discus patient will be doing the exercises and physical therapy within our department.  Sed icing regimen and home exercises, which activities to do and which ones to avoid.  Follow-up again in 6 to 8 weeks once weekly vitamin D given secondary to the limited.  Bone healing noted on exam.

## 2022-03-31 NOTE — Patient Instructions (Signed)
Once weekly Vit D See me in 4-6 weeks

## 2022-04-30 NOTE — Progress Notes (Signed)
  [place] Phone: [phone] Subjective:   Kristen [name], am serving as a scribe for Dr. Hulan Saas.  I'm seeing this patient by the request  of:  Patient, [name] Pcp Per  CC: Back and neck pain follow-up  [id number]  Kristen [name] is a 42 y.o. female coming in with complaint of back and neck pain. OMT 03/31/2022. Also f/u for humeral fx. Patient states that she continues to have upper back pain due to humeral fx.   Medications patient has been prescribed: None  Taking:         Reviewed prior external information including notes and imaging from previsou exam, outside providers and external EMR if available.   As well as notes that were available from care everywhere and other healthcare systems.  Past medical history, social, surgical and family history all reviewed in electronic medical record.  [name] pertanent information unless stated regarding to the chief complaint.   Past Medical History:  Diagnosis Date   Hypothyroidism    Infertility, female    Medical history non-contributory     [name] Known Allergies   Review of Systems:  [name] headache, visual changes, nausea, vomiting, diarrhea, constipation, dizziness, abdominal pain, skin rash, fevers, chills, night sweats, weight loss, swollen lymph nodes, body aches, joint swelling, chest pain, shortness of breath, mood changes. POSITIVE muscle aches  Objective  Blood pressure 110/74, pulse (!) 59, height 5\' 7"  (1.702 m), weight 163 lb (73.9 kg), SpO2 98 %, unknown if currently breastfeeding.   General: [name] apparent distress alert and oriented x3 mood and affect normal, dressed appropriately.  HEENT: Pupils equal, extraocular movements intact  Respiratory: Patient's speak in full sentences and does not appear short of breath  Cardiovascular: [name] lower extremity edema, non tender, [name] erythema  Patient does have tightness noted in the parascapular area and  right greater than left.  Some tenderness to palpation in this area.  Tightness more in the scapular area.  Osteopathic findings  C3 flexed rotated and side bent right C7 flexed rotated and side bent left T6 extended rotated and side bent right inhaled rib avoided higher up secondary to shoulder. L2 flexed rotated and side bent right Sacrum right on right       Assessment and Plan:  Scapular dyskinesis Scapular dyskinesis Plan discussed regimen and home exercises, discussed which activities to do and which ones to avoid, increase activity slowly.  Follow-up again in 6 to 8 weeks still healing from the humeral compression fracture but is significant improvement in range of motion at the moment.  Working with physical therapy still.  Closed comminuted right humeral fracture Improving slowly    Nonallopathic problems  Decision today to treat with OMT was based on Physical Exam  After verbal consent patient was treated with HVLA, ME, FPR techniques in cervical, rib, thoracic, lumbar, and sacral  areas  Patient tolerated the procedure well with improvement in symptoms  Patient given exercises, stretches and lifestyle modifications  See medications in patient instructions if given  Patient will follow up in 4-8 weeks    The above documentation has been reviewed and is accurate and complete Kristen Pulley, DO          Note: This dictation was prepared with Dragon dictation along with smaller phrase technology. Any transcriptional errors that result from this process are unintentional.

## 2022-05-04 ENCOUNTER — Ambulatory Visit: Payer: 59 | Admitting: Family Medicine

## 2022-05-04 ENCOUNTER — Encounter: Payer: Self-pay | Admitting: Family Medicine

## 2022-05-04 VITALS — BP 110/74 | HR 59 | Ht 67.0 in | Wt 163.0 lb

## 2022-05-04 DIAGNOSIS — M9901 Segmental and somatic dysfunction of cervical region: Secondary | ICD-10-CM | POA: Diagnosis not present

## 2022-05-04 DIAGNOSIS — G2589 Other specified extrapyramidal and movement disorders: Secondary | ICD-10-CM

## 2022-05-04 DIAGNOSIS — S42424A Nondisplaced comminuted supracondylar fracture without intercondylar fracture of right humerus, initial encounter for closed fracture: Secondary | ICD-10-CM | POA: Diagnosis not present

## 2022-05-04 DIAGNOSIS — M9902 Segmental and somatic dysfunction of thoracic region: Secondary | ICD-10-CM

## 2022-05-04 DIAGNOSIS — M9908 Segmental and somatic dysfunction of rib cage: Secondary | ICD-10-CM

## 2022-05-04 DIAGNOSIS — M9903 Segmental and somatic dysfunction of lumbar region: Secondary | ICD-10-CM | POA: Diagnosis not present

## 2022-05-04 NOTE — Patient Instructions (Signed)
Good to see you Glad shoulder is coming along See me in 7-8 weeks

## 2022-05-04 NOTE — Assessment & Plan Note (Addendum)
Improving slowly discussed with patient about continuing to work on the scapular area.

## 2022-05-04 NOTE — Assessment & Plan Note (Addendum)
Scapular dyskinesis Plan discussed regimen and home exercises, discussed which activities to do and which ones to avoid, increase activity slowly.  Follow-up again in 6 to 8 weeks still healing from the humeral compression fracture but is significant improvement in range of motion at the moment.  Working with physical therapy still.

## 2022-06-19 NOTE — Progress Notes (Unsigned)
[name] [address] [place] [address] Phone: [phone] Subjective:   [name], am serving as a scribe for Dr. Antoine Primas.  I'm seeing this patient by the request  of:  Ladora Daniel, PA-C  CC: back and neck pain   [id number]  Kristen [name] is a 42 y.o. female coming in with complaint of back and neck pain. OMT 05/04/2022. Patient states same per usual. No new concerns.  Patient has been doing relatively well.  Not having quite as much time for the different exercises.  Continuing to stay active.  Knows when she does the exercises does feel better.  Medications patient has been prescribed: Vit D  Taking:         Reviewed prior external information including notes and imaging from previsou exam, outside providers and external EMR if available.   As well as notes that were available from care everywhere and other healthcare systems.  Past medical history, social, surgical and family history all reviewed in electronic medical record.  No pertanent information unless stated regarding to the chief complaint.   Past Medical History:  Diagnosis Date   Hypothyroidism    Infertility, female    Medical history non-contributory     No Known Allergies   Review of Systems:  No headache, visual changes, nausea, vomiting, diarrhea, constipation, dizziness, abdominal pain, skin rash, fevers, chills, night sweats, weight loss, swollen lymph nodes, body aches, joint swelling, chest pain, shortness of breath, mood changes. POSITIVE muscle aches  Objective  Blood pressure 108/68, pulse 78, height 5\' 7"  (1.702 m), weight 168 lb (76.2 kg), SpO2 98 %, unknown if currently breastfeeding.   General: No apparent distress alert and oriented x3 mood and affect normal, dressed appropriately.  HEENT: Pupils equal, extraocular movements intact  Respiratory: Patient's speak in full sentences and does not appear short of breath  Cardiovascular: No  lower extremity edema, non tender, no erythema  :  Back tightness noted as well as tightness noted over the paraspinal musculature of the lumbar spine and even the cervical spine.  Tightness in the right parascapular area as well.  Osteopathic findings  C6 flexed rotated and side bent right T3 extended rotated and side bent right inhaled rib T9 extended rotated and side bent left L1 flexed rotated and side bent right L3 flexed rotated and side bent left Sacrum right on right       Assessment and Plan:  Scapular dyskinesis Chronic with some very mild exacerbation noted.  Patient is to be doing the exercises on a more regular basis but finds it difficult to do at the moment.  Discussed icing regimen and home exercises, discussed which activities to do and which ones to avoid.  Patient has had some weight gain recently and we will continue to monitor.  Follow-up with me again in 6 to 8 weeks    Nonallopathic problems  Decision today to treat with OMT was based on Physical Exam  After verbal consent patient was treated with HVLA, ME, FPR techniques in cervical, rib, thoracic, lumbar, and sacral  areas  Patient tolerated the procedure well with improvement in symptoms  Patient given exercises, stretches and lifestyle modifications  See medications in patient instructions if given  Patient will follow up in 4-8 weeks    The above documentation has been reviewed and is accurate and complete Judi Saa, DO          Note: This dictation was prepared  with Dragon dictation along with smaller phrase technology. Any transcriptional errors that result from this process are unintentional.

## 2022-06-22 ENCOUNTER — Ambulatory Visit: Payer: 59 | Admitting: Family Medicine

## 2022-06-22 ENCOUNTER — Encounter: Payer: Self-pay | Admitting: Family Medicine

## 2022-06-22 VITALS — BP 108/68 | HR 78 | Ht 67.0 in | Wt 168.0 lb

## 2022-06-22 DIAGNOSIS — G2589 Other specified extrapyramidal and movement disorders: Secondary | ICD-10-CM | POA: Diagnosis not present

## 2022-06-22 DIAGNOSIS — M9901 Segmental and somatic dysfunction of cervical region: Secondary | ICD-10-CM

## 2022-06-22 DIAGNOSIS — M9904 Segmental and somatic dysfunction of sacral region: Secondary | ICD-10-CM | POA: Diagnosis not present

## 2022-06-22 DIAGNOSIS — M9903 Segmental and somatic dysfunction of lumbar region: Secondary | ICD-10-CM

## 2022-06-22 DIAGNOSIS — M9902 Segmental and somatic dysfunction of thoracic region: Secondary | ICD-10-CM | POA: Diagnosis not present

## 2022-06-22 DIAGNOSIS — M9908 Segmental and somatic dysfunction of rib cage: Secondary | ICD-10-CM

## 2022-06-22 NOTE — Assessment & Plan Note (Signed)
Chronic with some very mild exacerbation noted.  Patient is to be doing the exercises on a more regular basis but finds it difficult to do at the moment.  Discussed icing regimen and home exercises, discussed which activities to do and which ones to avoid.  Patient has had some weight gain recently and we will continue to monitor.  Follow-up with me again in 6 to 8 weeks

## 2022-06-22 NOTE — Patient Instructions (Signed)
Good to see you! Probably about time to book that hotel See you again in 6-7 weeks

## 2022-08-03 NOTE — Progress Notes (Signed)
  [name] [address] [place] [address] Phone: [phone] Subjective:    I'm seeing this patient by the request  of:  Ladora Daniel, PA-C  CC: back   [id number]  Kristen [name] is a 42 y.o. female coming in with complaint of back and neck pain. OMT 06/22/2022. Patient states that she is pretty good just here for a tune up   Medications patient has been prescribed: Vit D  Taking:         Reviewed prior external information including notes and imaging from previsou exam, outside providers and external EMR if available.   As well as notes that were available from care everywhere and other healthcare systems.  Past medical history, social, surgical and family history all reviewed in electronic medical record.  No pertanent information unless stated regarding to the chief complaint.   Past Medical History:  Diagnosis Date   Hypothyroidism    Infertility, female    Medical history non-contributory     No Known Allergies   Review of Systems:  No headache, visual changes, nausea, vomiting, diarrhea, constipation, dizziness, abdominal pain, skin rash, fevers, chills, night sweats, weight loss, swollen lymph nodes, body aches, joint swelling, chest pain, shortness of breath, mood changes. POSITIVE muscle aches  Objective  Blood pressure 110/78, pulse 62, height 5\' 7"  (1.702 m), weight 165 lb (74.8 kg), SpO2 98 %, unknown if currently breastfeeding.   General: No apparent distress alert and oriented x3 mood and affect normal, dressed appropriately.  HEENT: Pupils equal, extraocular movements intact  Respiratory: Patient's speak in full sentences and does not appear short of breath  Cardiovascular: No lower extremity edema, non tender, no erythema  Patient does still have some tightness noted in the parascapular area.  There is minimal overall when it comes to the tenderness to palpation.  Patient's neck exam does have some limited  sidebending bilaterally.  Osteopathic findings  C2 flexed rotated and side bent right C7 flexed rotated and side bent left T3 extended rotated and side bent right inhaled rib L1 flexed rotated and side bent right Sacrum right on right    Assessment and Plan:  Scapular dyskinesis Chronic problem with some mild discomfort.  Discussed icing regimen and home exercises, discussed which activities to do and which ones to avoid.  Follow-up again in 6 to 8 weeks.    Nonallopathic problems  Decision today to treat with OMT was based on Physical Exam  After verbal consent patient was treated with HVLA, ME, FPR techniques in cervical, rib, thoracic, lumbar, and sacral  areas  Patient tolerated the procedure well with improvement in symptoms  Patient given exercises, stretches and lifestyle modifications  See medications in patient instructions if given  Patient will follow up in 4-8 weeks    The above documentation has been reviewed and is accurate and complete Judi Saa, DO          Note: This dictation was prepared with Dragon dictation along with smaller phrase technology. Any transcriptional errors that result from this process are unintentional.

## 2022-08-10 ENCOUNTER — Ambulatory Visit: Payer: 59 | Admitting: Family Medicine

## 2022-08-10 ENCOUNTER — Encounter: Payer: Self-pay | Admitting: Family Medicine

## 2022-08-10 VITALS — BP 110/78 | HR 62 | Ht 67.0 in | Wt 165.0 lb

## 2022-08-10 DIAGNOSIS — M9902 Segmental and somatic dysfunction of thoracic region: Secondary | ICD-10-CM

## 2022-08-10 DIAGNOSIS — M9901 Segmental and somatic dysfunction of cervical region: Secondary | ICD-10-CM | POA: Diagnosis not present

## 2022-08-10 DIAGNOSIS — M9904 Segmental and somatic dysfunction of sacral region: Secondary | ICD-10-CM | POA: Diagnosis not present

## 2022-08-10 DIAGNOSIS — G2589 Other specified extrapyramidal and movement disorders: Secondary | ICD-10-CM

## 2022-08-10 DIAGNOSIS — M9903 Segmental and somatic dysfunction of lumbar region: Secondary | ICD-10-CM

## 2022-08-10 DIAGNOSIS — M9908 Segmental and somatic dysfunction of rib cage: Secondary | ICD-10-CM | POA: Diagnosis not present

## 2022-08-10 NOTE — Assessment & Plan Note (Signed)
Chronic problem with some mild discomfort.  Discussed icing regimen and home exercises, discussed which activities to do and which ones to avoid.  Follow-up again in 6 to 8 weeks.

## 2022-08-10 NOTE — Patient Instructions (Signed)
Happy birthday to the mermaid  2-3 month follow up

## 2022-10-22 NOTE — Progress Notes (Signed)
  [name] [address] [place] [address] Phone: [phone] Subjective:   Kristen [name], am serving as a scribe for Dr. Antoine Primas.  I'm seeing this patient by the request  of:  Ladora Daniel, PA-C  CC: Hypermobility, back pain  [id number]  Kristen [name] is a 42 y.o. female coming in with complaint of back and neck pain. OMT 08/10/2022. Patient states that she is tight today.  Has been doing relatively well but does have some tightness noted.  Medications patient has been prescribed: None  Taking:         Reviewed prior external information including notes and imaging from previsou exam, outside providers and external EMR if available.   As well as notes that were available from care everywhere and other healthcare systems.  Past medical history, social, surgical and family history all reviewed in electronic medical record.  No pertanent information unless stated regarding to the chief complaint.   Past Medical History:  Diagnosis Date   Hypothyroidism    Infertility, female    Medical history non-contributory     No Known Allergies   Review of Systems:  No headache, visual changes, nausea, vomiting, diarrhea, constipation, dizziness, abdominal pain, skin rash, fevers, chills, night sweats, weight loss, swollen lymph nodes, body aches, joint swelling, chest pain, shortness of breath, mood changes. POSITIVE muscle aches  Objective  Blood pressure 112/82, pulse 60, height 5\' 7"  (1.702 m), weight 180 lb (81.6 kg), SpO2 97%, unknown if currently breastfeeding.   General: No apparent distress alert and oriented x3 mood and affect normal, dressed appropriately.  HEENT: Pupils equal, extraocular movements intact  Respiratory: Patient's speak in full sentences and does not appear short of breath  Cardiovascular: No lower extremity edema, non tender, no erythema  Upper back does have some tenderness to palpation noted.  Some  tenderness to palpation in the paraspinal musculature right greater than left.  Osteopathic findings  C2 flexed rotated and side bent right C5 flexed rotated and side bent left T3 extended rotated and side bent right inhaled rib T9 extended rotated and side bent left L2 flexed rotated and side bent right Sacrum right on right       Assessment and Plan:  Scapular dyskinesis Still having some difficulty with the scapular dyskinesis.  Seems to be worse when patient does get busy and also does not have as much time to do the exercises.  Increase activity slowly otherwise.  Discussed icing regimen and home exercises.  Follow-up again in 6 to 8 weeks.    Nonallopathic problems  Decision today to treat with OMT was based on Physical Exam  After verbal consent patient was treated with HVLA, ME, FPR techniques in cervical, rib, thoracic, lumbar, and sacral  areas  Patient tolerated the procedure well with improvement in symptoms  Patient given exercises, stretches and lifestyle modifications  See medications in patient instructions if given  Patient will follow up in 8 weeks     The above documentation has been reviewed and is accurate and complete Judi Saa, DO         Note: This dictation was prepared with Dragon dictation along with smaller phrase technology. Any transcriptional errors that result from this process are unintentional.

## 2022-10-23 ENCOUNTER — Ambulatory Visit: Payer: 59 | Admitting: Family Medicine

## 2022-10-23 ENCOUNTER — Encounter: Payer: Self-pay | Admitting: Family Medicine

## 2022-10-23 VITALS — BP 112/82 | HR 60 | Ht 67.0 in | Wt 180.0 lb

## 2022-10-23 DIAGNOSIS — M9902 Segmental and somatic dysfunction of thoracic region: Secondary | ICD-10-CM

## 2022-10-23 DIAGNOSIS — M9901 Segmental and somatic dysfunction of cervical region: Secondary | ICD-10-CM | POA: Diagnosis not present

## 2022-10-23 DIAGNOSIS — M9904 Segmental and somatic dysfunction of sacral region: Secondary | ICD-10-CM | POA: Diagnosis not present

## 2022-10-23 DIAGNOSIS — M9908 Segmental and somatic dysfunction of rib cage: Secondary | ICD-10-CM | POA: Diagnosis not present

## 2022-10-23 DIAGNOSIS — M9903 Segmental and somatic dysfunction of lumbar region: Secondary | ICD-10-CM

## 2022-10-23 DIAGNOSIS — G2589 Other specified extrapyramidal and movement disorders: Secondary | ICD-10-CM | POA: Diagnosis not present

## 2022-10-23 NOTE — Patient Instructions (Signed)
Good to see you! I like the mascot See you again in 2 months

## 2022-10-23 NOTE — Assessment & Plan Note (Signed)
Still having some difficulty with the scapular dyskinesis.  Seems to be worse when patient does get busy and also does not have as much time to do the exercises.  Increase activity slowly otherwise.  Discussed icing regimen and home exercises.  Follow-up again in 6 to 8 weeks.

## 2022-12-25 ENCOUNTER — Encounter: Payer: Self-pay | Admitting: Family Medicine

## 2022-12-25 ENCOUNTER — Ambulatory Visit: Payer: 59 | Admitting: Family Medicine

## 2022-12-25 VITALS — BP 106/78 | HR 90 | Ht 67.0 in | Wt 172.0 lb

## 2022-12-25 DIAGNOSIS — M9901 Segmental and somatic dysfunction of cervical region: Secondary | ICD-10-CM | POA: Diagnosis not present

## 2022-12-25 DIAGNOSIS — M9908 Segmental and somatic dysfunction of rib cage: Secondary | ICD-10-CM

## 2022-12-25 DIAGNOSIS — M9902 Segmental and somatic dysfunction of thoracic region: Secondary | ICD-10-CM | POA: Diagnosis not present

## 2022-12-25 DIAGNOSIS — M9903 Segmental and somatic dysfunction of lumbar region: Secondary | ICD-10-CM

## 2022-12-25 DIAGNOSIS — G2589 Other specified extrapyramidal and movement disorders: Secondary | ICD-10-CM

## 2022-12-25 DIAGNOSIS — M9904 Segmental and somatic dysfunction of sacral region: Secondary | ICD-10-CM

## 2022-12-25 NOTE — Assessment & Plan Note (Signed)
Scapular dyskinesis noted.  Discussed posture and ergonomics, which activities to do and which ones to avoid.  Increase activity slowly over the course the next several weeks.  Discussed icing regimen.  Follow-up with me again in 6 to 8 weeks otherwise.

## 2022-12-25 NOTE — Progress Notes (Signed)
  [name] [address] [place] [address] Phone: [phone] Subjective:   Kristen [name], am serving as a scribe for Dr. Antoine Primas.  I'm seeing this patient by the request  of:  Ladora Daniel, PA-C  CC: Neck and back pain follow-up  [id number]  Kristen [name] is a 42 y.o. female coming in with complaint of back and neck pain. OMT 10/23/2022. Patient states same per usual. No new concerns.  Medications patient has been prescribed: None  Taking:         Reviewed prior external information including notes and imaging from previsou exam, outside providers and external EMR if available.   As well as notes that were available from care everywhere and other healthcare systems.  Past medical history, social, surgical and family history all reviewed in electronic medical record.  No pertanent information unless stated regarding to the chief complaint.   Past Medical History:  Diagnosis Date   Hypothyroidism    Infertility, female    Medical history non-contributory     No Known Allergies   Review of Systems:  No headache, visual changes, nausea, vomiting, diarrhea, constipation, dizziness, abdominal pain, skin rash, fevers, chills, night sweats, weight loss, swollen lymph nodes, body aches, joint swelling, chest pain, shortness of breath, mood changes. POSITIVE muscle aches  Objective  Blood pressure 106/78, pulse 90, height 5\' 7"  (1.702 m), weight 172 lb (78 kg), SpO2 96%, unknown if currently breastfeeding.   General: No apparent distress alert and oriented x3 mood and affect normal, dressed appropriately.  HEENT: Pupils equal, extraocular movements intact  Respiratory: Patient's speak in full sentences and does not appear short of breath  Cardiovascular: No lower extremity edema, non tender, no erythema  Back does have some loss lordosis noted.  Some tenderness to palpation in the paraspinal musculature.  Osteopathic  findings  C2 flexed rotated and side bent right C6 flexed rotated and side bent left T3 extended rotated and side bent right inhaled rib T9 extended rotated and side bent left L2 flexed rotated and side bent right Sacrum right on right       Assessment and Plan:  Scapular dyskinesis Scapular dyskinesis noted.  Discussed posture and ergonomics, which activities to do and which ones to avoid.  Increase activity slowly over the course the next several weeks.  Discussed icing regimen.  Follow-up with me again in 6 to 8 weeks otherwise.    Nonallopathic problems  Decision today to treat with OMT was based on Physical Exam  After verbal consent patient was treated with HVLA, ME, FPR techniques in cervical, rib, thoracic, lumbar, and sacral  areas  Patient tolerated the procedure well with improvement in symptoms  Patient given exercises, stretches and lifestyle modifications  See medications in patient instructions if given  Patient will follow up in 4-8 weeks     The above documentation has been reviewed and is accurate and complete Judi Saa, DO         Note: This dictation was prepared with Dragon dictation along with smaller phrase technology. Any transcriptional errors that result from this process are unintentional.

## 2022-12-25 NOTE — Patient Instructions (Signed)
Good to see you! Good luck with baseball tryouts See you again in 2-3 months

## 2023-02-24 NOTE — Progress Notes (Signed)
  [name] [address] [place] [address] Phone: [phone] Subjective:   Kristen [name], am serving as a scribe for Dr. Arthea Claudene.\  I'm seeing this patient by the request  of:  Samie Frederick, PA-C  CC: Back and neck pain follow-up  [id number]  Kristen [name] is a 43 y.o. female coming in with complaint of back and neck pain. OMT 12/25/2023. Patient states that she is sore in her neck but is otherwise doing well.   Medications patient has been prescribed: None  Taking:         Reviewed prior external information including notes and imaging from previsou exam, outside providers and external EMR if available.   As well as notes that were available from care everywhere and other healthcare systems.  Past medical history, social, surgical and family history all reviewed in electronic medical record.  No pertanent information unless stated regarding to the chief complaint.   Past Medical History:  Diagnosis Date   Hypothyroidism    Infertility, female    Medical history non-contributory     No Known Allergies   Review of Systems:  No headache, visual changes, nausea, vomiting, diarrhea, constipation, dizziness, abdominal pain, skin rash, fevers, chills, night sweats, weight loss, swollen lymph nodes, body aches, joint swelling, chest pain, shortness of breath, mood changes. POSITIVE muscle aches  Objective  Blood pressure 108/72, pulse 84, height 5' 7 (1.702 m), weight 159 lb (72.1 kg), SpO2 99%, unknown if currently breastfeeding.   General: No apparent distress alert and oriented x3 mood and affect normal, dressed appropriately.  HEENT: Pupils equal, extraocular movements intact  Respiratory: Patient's speak in full sentences and does not appear short of breath  Cardiovascular: No lower extremity edema, non tender, no erythema  Low back does have some loss lordosis noted.  Some tenderness to palpation in the paraspinal  musculature.  Tightness with FABER test noted as well.  Osteopathic findings  C2 flexed rotated and side bent right C6 flexed rotated and side bent left T3 extended rotated and side bent right inhaled rib T9 extended rotated and side bent left L2 flexed rotated and side bent right L5 flexed rotated and side bent left. Sacrum right on right       Assessment and Plan:  Scapular dyskinesis Scapular dyskinesis noted.  Discussed icing regimen and home exercises, discussed avoiding certain activities.  Follow-up again in 6 to 8 weeks.  Overall patient is doing relatively well and has done remarkable with her weight loss that I think has been significantly helpful.    Nonallopathic problems  Decision today to treat with OMT was based on Physical Exam  After verbal consent patient was treated with HVLA, ME, FPR techniques in cervical, rib, thoracic, lumbar, and sacral  areas  Patient tolerated the procedure well with improvement in symptoms  Patient given exercises, stretches and lifestyle modifications  See medications in patient instructions if given  Patient will follow up in 4-8 weeks     The above documentation has been reviewed and is accurate and complete Kiing Deakin M Kylee Umana, DO         Note: This dictation was prepared with Dragon dictation along with smaller phrase technology. Any transcriptional errors that result from this process are unintentional.

## 2023-02-26 ENCOUNTER — Encounter: Payer: Self-pay | Admitting: Family Medicine

## 2023-02-26 ENCOUNTER — Ambulatory Visit: Payer: 59 | Admitting: Family Medicine

## 2023-02-26 VITALS — BP 108/72 | HR 84 | Ht 67.0 in | Wt 159.0 lb

## 2023-02-26 DIAGNOSIS — M9902 Segmental and somatic dysfunction of thoracic region: Secondary | ICD-10-CM

## 2023-02-26 DIAGNOSIS — M9904 Segmental and somatic dysfunction of sacral region: Secondary | ICD-10-CM

## 2023-02-26 DIAGNOSIS — M9901 Segmental and somatic dysfunction of cervical region: Secondary | ICD-10-CM | POA: Diagnosis not present

## 2023-02-26 DIAGNOSIS — M9903 Segmental and somatic dysfunction of lumbar region: Secondary | ICD-10-CM

## 2023-02-26 DIAGNOSIS — M9908 Segmental and somatic dysfunction of rib cage: Secondary | ICD-10-CM | POA: Diagnosis not present

## 2023-02-26 DIAGNOSIS — G2589 Other specified extrapyramidal and movement disorders: Secondary | ICD-10-CM

## 2023-02-26 NOTE — Patient Instructions (Signed)
Good to see you See me in 2-3 months

## 2023-02-26 NOTE — Assessment & Plan Note (Signed)
 Scapular dyskinesis noted.  Discussed icing regimen and home exercises, discussed avoiding certain activities.  Follow-up again in 6 to 8 weeks.  Overall patient is doing relatively well and has done remarkable with her weight loss that I think has been significantly helpful.

## 2023-05-06 NOTE — Progress Notes (Unsigned)
  [name] [address] [place] [address] Phone: [phone] Subjective:   [name], am serving as a scribe for Dr. Antoine Primas.  I'm seeing this patient by the request  of:  Ladora Daniel, PA-C  CC: Back and neck pain follow-up  [id number]  Kristen [name] is a 43 y.o. female coming in with complaint of back and neck pain. OMT 02/26/2023. Patient states same per usual. No changes and no new symptoms.  Overall has been doing relatively well.  Medications patient has been prescribed: None  Taking:         Reviewed prior external information including notes and imaging from previsou exam, outside providers and external EMR if available.   As well as notes that were available from care everywhere and other healthcare systems.  Past medical history, social, surgical and family history all reviewed in electronic medical record.  No pertanent information unless stated regarding to the chief complaint.   Past Medical History:  Diagnosis Date   Hypothyroidism    Infertility, female    Medical history non-contributory     Objective  Blood pressure 104/64, pulse 73, height 5\' 7"  (1.702 m), SpO2 98%, unknown if currently breastfeeding.   General: No apparent distress alert and oriented x3 mood and affect normal, dressed appropriately.  HEENT: Pupils equal, extraocular movements intact  Respiratory: Patient's speak in full sentences and does not appear short of breath  Cardiovascular: No lower extremity edema, non tender, no erythema  Gait normal MSK:  Back mild loss lordosis, mild tightness in the neck especially with sidebending bilaterally.  Osteopathic findings  C2 flexed rotated and side bent right C6 flexed rotated and side bent left T3 extended rotated and side bent right inhaled rib T9 extended rotated and side bent left L2 flexed rotated and side bent right Sacrum right on right       Assessment and  Plan:  Scapular dyskinesis Doing significantly better, has lost evaluate over the course of time and strengthening of her core.  Discussed with patient I do think that she is doing fantastic at this point and no other significant changes necessary.  Continues to respond relatively well to osteopathic manipulation.  Follow-up again in 3 months    Nonallopathic problems  Decision today to treat with OMT was based on Physical Exam  After verbal consent patient was treated with HVLA, ME, FPR techniques in cervical, rib, thoracic, lumbar, and sacral  areas  Patient tolerated the procedure well with improvement in symptoms  Patient given exercises, stretches and lifestyle modifications  See medications in patient instructions if given  Patient will follow up in 4-8 weeks    The above documentation has been reviewed and is accurate and complete Judi Saa, DO          Note: This dictation was prepared with Dragon dictation along with smaller phrase technology. Any transcriptional errors that result from this process are unintentional.

## 2023-05-07 ENCOUNTER — Encounter: Payer: Self-pay | Admitting: Family Medicine

## 2023-05-07 ENCOUNTER — Ambulatory Visit: Payer: 59 | Admitting: Family Medicine

## 2023-05-07 VITALS — BP 104/64 | HR 73 | Ht 67.0 in

## 2023-05-07 DIAGNOSIS — G2589 Other specified extrapyramidal and movement disorders: Secondary | ICD-10-CM

## 2023-05-07 DIAGNOSIS — M9903 Segmental and somatic dysfunction of lumbar region: Secondary | ICD-10-CM

## 2023-05-07 DIAGNOSIS — M9904 Segmental and somatic dysfunction of sacral region: Secondary | ICD-10-CM | POA: Diagnosis not present

## 2023-05-07 DIAGNOSIS — M9902 Segmental and somatic dysfunction of thoracic region: Secondary | ICD-10-CM

## 2023-05-07 DIAGNOSIS — M9908 Segmental and somatic dysfunction of rib cage: Secondary | ICD-10-CM

## 2023-05-07 DIAGNOSIS — M9901 Segmental and somatic dysfunction of cervical region: Secondary | ICD-10-CM | POA: Diagnosis not present

## 2023-05-07 NOTE — Assessment & Plan Note (Signed)
 Doing significantly better, has lost evaluate over the course of time and strengthening of her core.  Discussed with patient I do think that she is doing fantastic at this point and no other significant changes necessary.  Continues to respond relatively well to osteopathic manipulation.  Follow-up again in 3 months

## 2023-05-07 NOTE — Patient Instructions (Signed)
 Overall doing well Tell the kid to head for the cycle See you again in 2-3 months

## 2023-07-01 NOTE — Progress Notes (Signed)
  [facility] [address] [place] [address] Phone: [phone] Subjective:   Kristen [name], am serving as a scribe for Dr. Ronnell Coins.  I'Kristen seeing this patient by the request  of:  Gerrianne Krauss, PA-C  CC: Back and neck pain follow-up  IEP:[id number]  Kristen [name] is a 43 y.o. female coming in with complaint of back and neck pain. OMT 05/07/2023. Patient states   Medications patient has been prescribed: None  Taking:         Reviewed prior external information including notes and imaging from previsou exam, outside providers and external EMR if available.   As well as notes that were available from care everywhere and other healthcare systems.  Past medical history, social, surgical and family history all reviewed in electronic medical record.  No pertanent information unless stated regarding to the chief complaint.   Past Medical History:  Diagnosis Date   Hypothyroidism    Infertility, female    Medical history non-contributory     No Known Allergies   Review of Systems:  No headache, visual changes, nausea, vomiting, diarrhea, constipation, dizziness, abdominal pain, skin rash, fevers, chills, night sweats, weight loss, swollen lymph nodes, body aches, joint swelling, chest pain, shortness of breath, mood changes. POSITIVE muscle aches  Objective  Blood pressure 102/64, height 5\' 7"  (1.702 Kristen), weight 143 lb (64.9 kg), unknown if currently breastfeeding.   General: No apparent distress alert and oriented x3 mood and affect normal, dressed appropriately.  HEENT: Pupils equal, extraocular movements intact  Respiratory: Patient's speak in full sentences and does not appear short of breath  Cardiovascular: No lower extremity edema, non tender, no erythema  Gait MSK:  Back still some scapular dyskinesis but very mild overall, continuing to make improvement.  Still has more tenderness to palpation over the right side of the  paraspinal musculature.  Osteopathic findings  C2 flexed rotated and side bent right C6 flexed rotated and side bent left T3 extended rotated and side bent right inhaled rib T7 extended rotated and side bent right L2 flexed rotated and side bent right L3 flexed rotated and side bent left Sacrum right on right     Assessment and Plan:  Scapular dyskinesis Continues to make improvement.  Discussed icing regimen and home exercises, discussed which activities to do and which ones to avoid.  Increase activity slowly otherwise.  Discussed icing regimen.  Follow-up again in 6 to 8 weeks.    Nonallopathic problems  Decision today to treat with OMT was based on Physical Exam  After verbal consent patient was treated with HVLA, ME, FPR techniques in cervical, rib, thoracic, lumbar, and sacral  areas  Patient tolerated the procedure well with improvement in symptoms  Patient given exercises, stretches and lifestyle modifications  See medications in patient instructions if given  Patient will follow up in 4-8 weeks     The above documentation has been reviewed and is accurate and complete Kristen [name] Kristen Carlita Whitcomb, DO         Note: This dictation was prepared with Dragon dictation along with smaller phrase technology. Any transcriptional errors that result from this process are unintentional.

## 2023-07-05 ENCOUNTER — Ambulatory Visit: Admitting: Family Medicine

## 2023-07-05 ENCOUNTER — Encounter: Payer: Self-pay | Admitting: Family Medicine

## 2023-07-05 VITALS — BP 102/64 | Ht 67.0 in | Wt 143.0 lb

## 2023-07-05 DIAGNOSIS — M9901 Segmental and somatic dysfunction of cervical region: Secondary | ICD-10-CM

## 2023-07-05 DIAGNOSIS — G2589 Other specified extrapyramidal and movement disorders: Secondary | ICD-10-CM | POA: Diagnosis not present

## 2023-07-05 DIAGNOSIS — M9904 Segmental and somatic dysfunction of sacral region: Secondary | ICD-10-CM

## 2023-07-05 DIAGNOSIS — M9908 Segmental and somatic dysfunction of rib cage: Secondary | ICD-10-CM | POA: Diagnosis not present

## 2023-07-05 DIAGNOSIS — M9902 Segmental and somatic dysfunction of thoracic region: Secondary | ICD-10-CM

## 2023-07-05 DIAGNOSIS — M9903 Segmental and somatic dysfunction of lumbar region: Secondary | ICD-10-CM | POA: Diagnosis not present

## 2023-07-05 NOTE — Assessment & Plan Note (Signed)
 Continues to make improvement.  Discussed icing regimen and home exercises, discussed which activities to do and which ones to avoid.  Increase activity slowly otherwise.  Discussed icing regimen.  Follow-up again in 6 to 8 weeks.

## 2023-07-05 NOTE — Patient Instructions (Signed)
 Good to see you Enjoy Arlen Belton See me in 2 months

## 2023-09-10 NOTE — Progress Notes (Signed)
  [name] [address] [place] [address] Phone: [phone] Subjective:   Kristen [name], am serving as a scribe for Dr. Arthea [name].  I'Kristen seeing this patient by the request  of:  Kristen Frederick, PA-C  CC: Upper back pain follow-up  YEP:Dlagzrupcz  Kristen [name] is a 43 y.o. female coming in with complaint of back and neck pain. OMT 07/05/2023. Patient states doing well. No new symptoms.  Medications patient has been prescribed: None  Taking:         Reviewed prior external information including notes and imaging from previsou exam, outside providers and external EMR if available.   As well as notes that were available from care everywhere and other healthcare systems.  Past medical history, social, surgical and family history all reviewed in electronic medical record.  No pertanent information unless stated regarding to the chief complaint.   Past Medical History:  Diagnosis Date   Hypothyroidism    Infertility, female    Medical history non-contributory     No Known Allergies   Review of Systems:  No headache, visual changes, nausea, vomiting, diarrhea, constipation, dizziness, abdominal pain, skin rash, fevers, chills, night sweats, weight loss, swollen lymph nodes, body aches, joint swelling, chest pain, shortness of breath, mood changes. POSITIVE muscle aches  Objective  Blood pressure 104/76, pulse (!) 56, height 5' 7 (1.702 Kristen), weight 138 lb (62.6 kg), SpO2 98%, unknown if currently breastfeeding.   General: No apparent distress alert and oriented x3 mood and affect normal, dressed appropriately.  HEENT: Pupils equal, extraocular movements intact  Respiratory: Patient's speak in full sentences and does not appear short of breath  Cardiovascular: No lower extremity edema, non tender, no erythema  Gait MSK:  Back does have more tightness between the parascapular areas.  Seems to be a little more right greater than left.   Some mild dyskinesis noted.  Neck exam does have some limited sidebending  Osteopathic findings  C4 flexed rotated and side bent right C6 flexed rotated and side bent left T4 extended rotated and side bent right inhaled rib T6 extended rotated and side bent left       Assessment and Plan:  Scapular dyskinesis Continue to do well with conservative therapy.  Does respond well to osteopathic manipulation for the amount of pathic diagnosis.  Will continue to work on [employer].  Patient's kids are getting older so she is not holding them as much and I think this has been more beneficial.  Follow-up again in 2 to 3 months    Nonallopathic problems  Decision today to treat with OMT was based on Physical Exam  After verbal consent patient was treated with HVLA, ME, FPR techniques in cervical, rib, thoracic areas  Patient tolerated the procedure well with improvement in symptoms  Patient given exercises, stretches and lifestyle modifications  See medications in patient instructions if given  Patient will follow up in 4-8 weeks      The above documentation has been reviewed and is accurate and complete Kristen [name] Kristen Kate Larock, DO        Note: This dictation was prepared with Dragon dictation along with smaller phrase technology. Any transcriptional errors that result from this process are unintentional.

## 2023-09-13 ENCOUNTER — Encounter: Payer: Self-pay | Admitting: Family Medicine

## 2023-09-13 ENCOUNTER — Ambulatory Visit: Admitting: Family Medicine

## 2023-09-13 VITALS — BP 104/76 | HR 56 | Ht 67.0 in | Wt 138.0 lb

## 2023-09-13 DIAGNOSIS — M9908 Segmental and somatic dysfunction of rib cage: Secondary | ICD-10-CM | POA: Diagnosis not present

## 2023-09-13 DIAGNOSIS — G2589 Other specified extrapyramidal and movement disorders: Secondary | ICD-10-CM | POA: Diagnosis not present

## 2023-09-13 DIAGNOSIS — M9901 Segmental and somatic dysfunction of cervical region: Secondary | ICD-10-CM

## 2023-09-13 DIAGNOSIS — M9902 Segmental and somatic dysfunction of thoracic region: Secondary | ICD-10-CM | POA: Diagnosis not present

## 2023-09-13 NOTE — Assessment & Plan Note (Signed)
 Continue to do well with conservative therapy.  Does respond well to osteopathic manipulation for the amount of pathic diagnosis.  Will continue to work on [employer].  Patient's kids are getting older so she is not holding them as much and I think this has been more beneficial.  Follow-up again in 2 to 3 months

## 2023-11-11 NOTE — Progress Notes (Deleted)
  [name] [address] [place] [address] Phone: [phone] Subjective:    I'm seeing this patient by the request  of:  Samie Frederick, PA-C  CC:   YEP:Dlagzrupcz  Kristen [name] is a 43 y.o. female coming in with complaint of back and neck pain. OMT on 09/13/2023. Patient states   Medications patient has been prescribed:   Taking:         Reviewed prior external information including notes and imaging from previsou exam, outside providers and external EMR if available.   As well as notes that were available from care everywhere and other healthcare systems.  Past medical history, social, surgical and family history all reviewed in electronic medical record.  No pertanent information unless stated regarding to the chief complaint.   Past Medical History:  Diagnosis Date   Hypothyroidism    Infertility, female    Medical history non-contributory     No Known Allergies   Review of Systems:  No headache, visual changes, nausea, vomiting, diarrhea, constipation, dizziness, abdominal pain, skin rash, fevers, chills, night sweats, weight loss, swollen lymph nodes, body aches, joint swelling, chest pain, shortness of breath, mood changes. POSITIVE muscle aches  Objective  unknown if currently breastfeeding.   General: No apparent distress alert and oriented x3 mood and affect normal, dressed appropriately.  HEENT: Pupils equal, extraocular movements intact  Respiratory: Patient's speak in full sentences and does not appear short of breath  Cardiovascular: No lower extremity edema, non tender, no erythema  Gait MSK:  Back   Osteopathic findings  C2 flexed rotated and side bent right C6 flexed rotated and side bent left T3 extended rotated and side bent right inhaled rib T9 extended rotated and side bent left L2 flexed rotated and side bent right Sacrum right on right       Assessment and Plan:  No problem-specific  Assessment & Plan notes found for this encounter.    Nonallopathic problems  Decision today to treat with OMT was based on Physical Exam  After verbal consent patient was treated with HVLA, ME, FPR techniques in cervical, rib, thoracic, lumbar, and sacral  areas  Patient tolerated the procedure well with improvement in symptoms  Patient given exercises, stretches and lifestyle modifications  See medications in patient instructions if given  Patient will follow up in 4-8 weeks             Note: This dictation was prepared with Dragon dictation along with smaller phrase technology. Any transcriptional errors that result from this process are unintentional.

## 2023-11-16 ENCOUNTER — Ambulatory Visit: Admitting: Family Medicine

## 2023-12-10 NOTE — Progress Notes (Deleted)
  [name] [address] [place] [address] Phone: [phone] Subjective:    I'm seeing this patient by the request  of:  Samie Frederick, PA-C  CC:   YEP:Dlagzrupcz  Kristen [name] is a 43 y.o. female coming in with complaint of back and neck pain. OMT on 09/13/2023. Patient states   Medications patient has been prescribed:   Taking:         Reviewed prior external information including notes and imaging from previsou exam, outside providers and external EMR if available.   As well as notes that were available from care everywhere and other healthcare systems.  Past medical history, social, surgical and family history all reviewed in electronic medical record.  No pertanent information unless stated regarding to the chief complaint.   Past Medical History:  Diagnosis Date   Hypothyroidism    Infertility, female    Medical history non-contributory     No Known Allergies   Review of Systems:  No headache, visual changes, nausea, vomiting, diarrhea, constipation, dizziness, abdominal pain, skin rash, fevers, chills, night sweats, weight loss, swollen lymph nodes, body aches, joint swelling, chest pain, shortness of breath, mood changes. POSITIVE muscle aches  Objective  unknown if currently breastfeeding.   General: No apparent distress alert and oriented x3 mood and affect normal, dressed appropriately.  HEENT: Pupils equal, extraocular movements intact  Respiratory: Patient's speak in full sentences and does not appear short of breath  Cardiovascular: No lower extremity edema, non tender, no erythema  Gait MSK:  Back   Osteopathic findings  C2 flexed rotated and side bent right C6 flexed rotated and side bent left T3 extended rotated and side bent right inhaled rib T9 extended rotated and side bent left L2 flexed rotated and side bent right Sacrum right on right       Assessment and Plan:  No problem-specific  Assessment & Plan notes found for this encounter.    Nonallopathic problems  Decision today to treat with OMT was based on Physical Exam  After verbal consent patient was treated with HVLA, ME, FPR techniques in cervical, rib, thoracic, lumbar, and sacral  areas  Patient tolerated the procedure well with improvement in symptoms  Patient given exercises, stretches and lifestyle modifications  See medications in patient instructions if given  Patient will follow up in 4-8 weeks             Note: This dictation was prepared with Dragon dictation along with smaller phrase technology. Any transcriptional errors that result from this process are unintentional.

## 2023-12-16 ENCOUNTER — Ambulatory Visit: Admitting: Family Medicine

## 2024-01-03 NOTE — Progress Notes (Deleted)
  [facility] [address] [place] [address] Phone: [phone] Subjective:    I'm seeing this patient by the request  of:  Samie Frederick, PA-C  CC: Low back and neck pain follow-up  YEP:Dlagzrupcz  Kristen [name] is a 43 y.o. female coming in with complaint of back and neck pain. OMT on 09/13/2023. Patient states   Medications patient has been prescribed: None  Taking:         Reviewed prior external information including notes and imaging from previsou exam, outside providers and external EMR if available.   As well as notes that were available from care everywhere and other healthcare systems.  Past medical history, social, surgical and family history all reviewed in electronic medical record.  No pertanent information unless stated regarding to the chief complaint.   Past Medical History:  Diagnosis Date   Hypothyroidism    Infertility, female    Medical history non-contributory     No Known Allergies   Review of Systems:  No headache, visual changes, nausea, vomiting, diarrhea, constipation, dizziness, abdominal pain, skin rash, fevers, chills, night sweats, weight loss, swollen lymph nodes, body aches, joint swelling, chest pain, shortness of breath, mood changes. POSITIVE muscle aches  Objective  unknown if currently breastfeeding.   General: No apparent distress alert and oriented x3 mood and affect normal, dressed appropriately.  HEENT: Pupils equal, extraocular movements intact  Respiratory: Patient's speak in full sentences and does not appear short of breath  Cardiovascular: No lower extremity edema, non tender, no erythema  Gait MSK:  Back   Osteopathic findings  C2 flexed rotated and side bent right C6 flexed rotated and side bent left T3 extended rotated and side bent right inhaled rib T9 extended rotated and side bent left L2 flexed rotated and side bent right Sacrum right on right    Assessment and  Plan:  No problem-specific Assessment & Plan notes found for this encounter.    Nonallopathic problems  Decision today to treat with OMT was based on Physical Exam  After verbal consent patient was treated with HVLA, ME, FPR techniques in cervical, rib, thoracic, lumbar, and sacral  areas  Patient tolerated the procedure well with improvement in symptoms  Patient given exercises, stretches and lifestyle modifications  See medications in patient instructions if given  Patient will follow up in 4-8 weeks    The above documentation has been reviewed and is accurate and complete Melessa Cowell M Gerda Yin, DO          Note: This dictation was prepared with Dragon dictation along with smaller phrase technology. Any transcriptional errors that result from this process are unintentional.

## 2024-01-06 ENCOUNTER — Ambulatory Visit: Admitting: Family Medicine
